# Patient Record
Sex: Female | Born: 1955 | Race: Black or African American | Hispanic: No | Marital: Married | Smoking: Never smoker
Health system: Southern US, Community
[De-identification: ages and names within clinical notes are randomized; demographics above are authoritative.]

## PROBLEM LIST (undated history)

## (undated) DIAGNOSIS — C50919 Malignant neoplasm of unspecified site of unspecified female breast: Secondary | ICD-10-CM

## (undated) DIAGNOSIS — J45909 Unspecified asthma, uncomplicated: Secondary | ICD-10-CM

## (undated) DIAGNOSIS — J189 Pneumonia, unspecified organism: Secondary | ICD-10-CM

## (undated) DIAGNOSIS — R0600 Dyspnea, unspecified: Secondary | ICD-10-CM

## (undated) DIAGNOSIS — I10 Essential (primary) hypertension: Secondary | ICD-10-CM

## (undated) DIAGNOSIS — F32A Depression, unspecified: Secondary | ICD-10-CM

## (undated) DIAGNOSIS — Z87442 Personal history of urinary calculi: Secondary | ICD-10-CM

## (undated) DIAGNOSIS — E119 Type 2 diabetes mellitus without complications: Secondary | ICD-10-CM

## (undated) DIAGNOSIS — K219 Gastro-esophageal reflux disease without esophagitis: Secondary | ICD-10-CM

## (undated) DIAGNOSIS — M1990 Unspecified osteoarthritis, unspecified site: Secondary | ICD-10-CM

## (undated) DIAGNOSIS — E785 Hyperlipidemia, unspecified: Secondary | ICD-10-CM

## (undated) HISTORY — DX: Type 2 diabetes mellitus without complications: E11.9

## (undated) HISTORY — PX: BREAST SURGERY: SHX581

## (undated) HISTORY — DX: Hyperlipidemia, unspecified: E78.5

## (undated) HISTORY — DX: Malignant neoplasm of unspecified site of unspecified female breast: C50.919

## (undated) HISTORY — PX: MASTECTOMY: SHX3

## (undated) HISTORY — PX: COLONOSCOPY: SHX174

## (undated) HISTORY — DX: Unspecified asthma, uncomplicated: J45.909

## (undated) HISTORY — PX: TUBAL LIGATION: SHX77

## (undated) HISTORY — DX: Essential (primary) hypertension: I10

---

## 2005-09-27 ENCOUNTER — Ambulatory Visit: Payer: Self-pay | Admitting: Cardiology

## 2014-02-05 ENCOUNTER — Encounter (INDEPENDENT_AMBULATORY_CARE_PROVIDER_SITE_OTHER): Payer: Self-pay | Admitting: Ophthalmology

## 2014-03-31 ENCOUNTER — Encounter (INDEPENDENT_AMBULATORY_CARE_PROVIDER_SITE_OTHER): Payer: Self-pay | Admitting: Ophthalmology

## 2014-04-07 ENCOUNTER — Encounter (INDEPENDENT_AMBULATORY_CARE_PROVIDER_SITE_OTHER): Payer: Self-pay | Admitting: Ophthalmology

## 2014-05-28 ENCOUNTER — Encounter (INDEPENDENT_AMBULATORY_CARE_PROVIDER_SITE_OTHER): Payer: Self-pay | Admitting: Ophthalmology

## 2014-06-06 ENCOUNTER — Encounter (INDEPENDENT_AMBULATORY_CARE_PROVIDER_SITE_OTHER): Payer: Medicare Other | Admitting: Ophthalmology

## 2014-06-06 DIAGNOSIS — H3531 Nonexudative age-related macular degeneration: Secondary | ICD-10-CM | POA: Diagnosis not present

## 2014-06-06 DIAGNOSIS — H43813 Vitreous degeneration, bilateral: Secondary | ICD-10-CM

## 2014-06-06 DIAGNOSIS — E11339 Type 2 diabetes mellitus with moderate nonproliferative diabetic retinopathy without macular edema: Secondary | ICD-10-CM

## 2014-06-06 DIAGNOSIS — E11319 Type 2 diabetes mellitus with unspecified diabetic retinopathy without macular edema: Secondary | ICD-10-CM

## 2014-06-06 DIAGNOSIS — I10 Essential (primary) hypertension: Secondary | ICD-10-CM | POA: Diagnosis not present

## 2014-06-06 DIAGNOSIS — H35033 Hypertensive retinopathy, bilateral: Secondary | ICD-10-CM

## 2015-02-11 ENCOUNTER — Emergency Department (HOSPITAL_COMMUNITY)
Admission: EM | Admit: 2015-02-11 | Discharge: 2015-02-11 | Discharge status: Absent without leave | Payer: Medicaid Other | Attending: Emergency Medicine | Admitting: Emergency Medicine

## 2015-02-11 NOTE — ED Notes (Signed)
Bed: MG50 Expected date: 02/11/15 Expected time: 8:18 PM Means of arrival: Ambulance Comments: 59 yr wheezing

## 2015-06-22 ENCOUNTER — Ambulatory Visit (INDEPENDENT_AMBULATORY_CARE_PROVIDER_SITE_OTHER): Payer: Medicare Other | Admitting: Ophthalmology

## 2015-06-29 DIAGNOSIS — Z1231 Encounter for screening mammogram for malignant neoplasm of breast: Secondary | ICD-10-CM | POA: Diagnosis not present

## 2015-06-29 DIAGNOSIS — Z9012 Acquired absence of left breast and nipple: Secondary | ICD-10-CM | POA: Diagnosis not present

## 2015-09-25 DIAGNOSIS — I10 Essential (primary) hypertension: Secondary | ICD-10-CM | POA: Diagnosis not present

## 2015-09-25 DIAGNOSIS — Z0000 Encounter for general adult medical examination without abnormal findings: Secondary | ICD-10-CM | POA: Diagnosis not present

## 2015-09-25 DIAGNOSIS — E119 Type 2 diabetes mellitus without complications: Secondary | ICD-10-CM | POA: Diagnosis not present

## 2015-12-25 DIAGNOSIS — E119 Type 2 diabetes mellitus without complications: Secondary | ICD-10-CM | POA: Diagnosis not present

## 2015-12-25 DIAGNOSIS — Z0000 Encounter for general adult medical examination without abnormal findings: Secondary | ICD-10-CM | POA: Diagnosis not present

## 2015-12-25 DIAGNOSIS — I10 Essential (primary) hypertension: Secondary | ICD-10-CM | POA: Diagnosis not present

## 2015-12-25 DIAGNOSIS — Z6829 Body mass index (BMI) 29.0-29.9, adult: Secondary | ICD-10-CM | POA: Diagnosis not present

## 2015-12-25 DIAGNOSIS — L658 Other specified nonscarring hair loss: Secondary | ICD-10-CM | POA: Diagnosis not present

## 2016-01-27 DIAGNOSIS — E119 Type 2 diabetes mellitus without complications: Secondary | ICD-10-CM | POA: Diagnosis not present

## 2016-01-27 DIAGNOSIS — I10 Essential (primary) hypertension: Secondary | ICD-10-CM | POA: Diagnosis not present

## 2016-03-08 DIAGNOSIS — I10 Essential (primary) hypertension: Secondary | ICD-10-CM | POA: Diagnosis not present

## 2016-03-08 DIAGNOSIS — E119 Type 2 diabetes mellitus without complications: Secondary | ICD-10-CM | POA: Diagnosis not present

## 2016-03-28 DIAGNOSIS — M1711 Unilateral primary osteoarthritis, right knee: Secondary | ICD-10-CM | POA: Diagnosis not present

## 2016-03-28 DIAGNOSIS — E119 Type 2 diabetes mellitus without complications: Secondary | ICD-10-CM | POA: Diagnosis not present

## 2016-03-28 DIAGNOSIS — I10 Essential (primary) hypertension: Secondary | ICD-10-CM | POA: Diagnosis not present

## 2016-04-12 DIAGNOSIS — I10 Essential (primary) hypertension: Secondary | ICD-10-CM | POA: Diagnosis not present

## 2016-04-12 DIAGNOSIS — E119 Type 2 diabetes mellitus without complications: Secondary | ICD-10-CM | POA: Diagnosis not present

## 2016-04-29 DIAGNOSIS — E119 Type 2 diabetes mellitus without complications: Secondary | ICD-10-CM | POA: Diagnosis not present

## 2016-04-29 DIAGNOSIS — I10 Essential (primary) hypertension: Secondary | ICD-10-CM | POA: Diagnosis not present

## 2016-04-29 DIAGNOSIS — M1711 Unilateral primary osteoarthritis, right knee: Secondary | ICD-10-CM | POA: Diagnosis not present

## 2016-05-10 DIAGNOSIS — E119 Type 2 diabetes mellitus without complications: Secondary | ICD-10-CM | POA: Diagnosis not present

## 2016-05-10 DIAGNOSIS — I10 Essential (primary) hypertension: Secondary | ICD-10-CM | POA: Diagnosis not present

## 2016-06-06 DIAGNOSIS — I10 Essential (primary) hypertension: Secondary | ICD-10-CM | POA: Diagnosis not present

## 2016-06-06 DIAGNOSIS — E119 Type 2 diabetes mellitus without complications: Secondary | ICD-10-CM | POA: Diagnosis not present

## 2016-06-13 DIAGNOSIS — E1140 Type 2 diabetes mellitus with diabetic neuropathy, unspecified: Secondary | ICD-10-CM | POA: Diagnosis not present

## 2016-06-13 DIAGNOSIS — L84 Corns and callosities: Secondary | ICD-10-CM | POA: Diagnosis not present

## 2016-07-01 DIAGNOSIS — E119 Type 2 diabetes mellitus without complications: Secondary | ICD-10-CM | POA: Diagnosis not present

## 2016-07-01 DIAGNOSIS — I10 Essential (primary) hypertension: Secondary | ICD-10-CM | POA: Diagnosis not present

## 2016-07-05 DIAGNOSIS — Z1231 Encounter for screening mammogram for malignant neoplasm of breast: Secondary | ICD-10-CM | POA: Diagnosis not present

## 2016-07-29 DIAGNOSIS — Z0000 Encounter for general adult medical examination without abnormal findings: Secondary | ICD-10-CM | POA: Diagnosis not present

## 2016-07-29 DIAGNOSIS — I10 Essential (primary) hypertension: Secondary | ICD-10-CM | POA: Diagnosis not present

## 2016-07-29 DIAGNOSIS — M1711 Unilateral primary osteoarthritis, right knee: Secondary | ICD-10-CM | POA: Diagnosis not present

## 2016-07-29 DIAGNOSIS — Z1389 Encounter for screening for other disorder: Secondary | ICD-10-CM | POA: Diagnosis not present

## 2016-07-29 DIAGNOSIS — E119 Type 2 diabetes mellitus without complications: Secondary | ICD-10-CM | POA: Diagnosis not present

## 2016-09-23 DIAGNOSIS — I10 Essential (primary) hypertension: Secondary | ICD-10-CM | POA: Diagnosis not present

## 2016-09-23 DIAGNOSIS — E119 Type 2 diabetes mellitus without complications: Secondary | ICD-10-CM | POA: Diagnosis not present

## 2016-10-26 DIAGNOSIS — E119 Type 2 diabetes mellitus without complications: Secondary | ICD-10-CM | POA: Diagnosis not present

## 2016-10-26 DIAGNOSIS — I10 Essential (primary) hypertension: Secondary | ICD-10-CM | POA: Diagnosis not present

## 2016-10-31 DIAGNOSIS — J40 Bronchitis, not specified as acute or chronic: Secondary | ICD-10-CM | POA: Diagnosis not present

## 2016-10-31 DIAGNOSIS — Z0000 Encounter for general adult medical examination without abnormal findings: Secondary | ICD-10-CM | POA: Diagnosis not present

## 2016-10-31 DIAGNOSIS — M1711 Unilateral primary osteoarthritis, right knee: Secondary | ICD-10-CM | POA: Diagnosis not present

## 2016-10-31 DIAGNOSIS — E119 Type 2 diabetes mellitus without complications: Secondary | ICD-10-CM | POA: Diagnosis not present

## 2016-10-31 DIAGNOSIS — I10 Essential (primary) hypertension: Secondary | ICD-10-CM | POA: Diagnosis not present

## 2016-12-15 DIAGNOSIS — M810 Age-related osteoporosis without current pathological fracture: Secondary | ICD-10-CM | POA: Diagnosis not present

## 2016-12-15 DIAGNOSIS — M85851 Other specified disorders of bone density and structure, right thigh: Secondary | ICD-10-CM | POA: Diagnosis not present

## 2017-01-30 DIAGNOSIS — I10 Essential (primary) hypertension: Secondary | ICD-10-CM | POA: Diagnosis not present

## 2017-01-30 DIAGNOSIS — E119 Type 2 diabetes mellitus without complications: Secondary | ICD-10-CM | POA: Diagnosis not present

## 2017-01-31 DIAGNOSIS — J3089 Other allergic rhinitis: Secondary | ICD-10-CM | POA: Diagnosis not present

## 2017-01-31 DIAGNOSIS — E119 Type 2 diabetes mellitus without complications: Secondary | ICD-10-CM | POA: Diagnosis not present

## 2017-01-31 DIAGNOSIS — I10 Essential (primary) hypertension: Secondary | ICD-10-CM | POA: Diagnosis not present

## 2017-02-12 DIAGNOSIS — W010XXA Fall on same level from slipping, tripping and stumbling without subsequent striking against object, initial encounter: Secondary | ICD-10-CM | POA: Diagnosis not present

## 2017-02-12 DIAGNOSIS — E119 Type 2 diabetes mellitus without complications: Secondary | ICD-10-CM | POA: Diagnosis not present

## 2017-02-12 DIAGNOSIS — I10 Essential (primary) hypertension: Secondary | ICD-10-CM | POA: Diagnosis not present

## 2017-02-12 DIAGNOSIS — S76912A Strain of unspecified muscles, fascia and tendons at thigh level, left thigh, initial encounter: Secondary | ICD-10-CM | POA: Diagnosis not present

## 2017-02-12 DIAGNOSIS — Z853 Personal history of malignant neoplasm of breast: Secondary | ICD-10-CM | POA: Diagnosis not present

## 2017-02-12 DIAGNOSIS — Z79899 Other long term (current) drug therapy: Secondary | ICD-10-CM | POA: Diagnosis not present

## 2017-02-12 DIAGNOSIS — M79652 Pain in left thigh: Secondary | ICD-10-CM | POA: Diagnosis not present

## 2017-02-12 DIAGNOSIS — Z7984 Long term (current) use of oral hypoglycemic drugs: Secondary | ICD-10-CM | POA: Diagnosis not present

## 2017-02-12 DIAGNOSIS — S8992XA Unspecified injury of left lower leg, initial encounter: Secondary | ICD-10-CM | POA: Diagnosis not present

## 2017-02-21 DIAGNOSIS — S7012XA Contusion of left thigh, initial encounter: Secondary | ICD-10-CM | POA: Diagnosis not present

## 2017-05-22 DIAGNOSIS — I10 Essential (primary) hypertension: Secondary | ICD-10-CM | POA: Diagnosis not present

## 2017-05-22 DIAGNOSIS — E119 Type 2 diabetes mellitus without complications: Secondary | ICD-10-CM | POA: Diagnosis not present

## 2017-06-06 DIAGNOSIS — J45998 Other asthma: Secondary | ICD-10-CM | POA: Diagnosis not present

## 2017-06-06 DIAGNOSIS — Z1389 Encounter for screening for other disorder: Secondary | ICD-10-CM | POA: Diagnosis not present

## 2017-06-06 DIAGNOSIS — M1711 Unilateral primary osteoarthritis, right knee: Secondary | ICD-10-CM | POA: Diagnosis not present

## 2017-06-06 DIAGNOSIS — E119 Type 2 diabetes mellitus without complications: Secondary | ICD-10-CM | POA: Diagnosis not present

## 2017-06-06 DIAGNOSIS — S7012XA Contusion of left thigh, initial encounter: Secondary | ICD-10-CM | POA: Diagnosis not present

## 2017-06-06 DIAGNOSIS — I10 Essential (primary) hypertension: Secondary | ICD-10-CM | POA: Diagnosis not present

## 2017-06-06 DIAGNOSIS — E1165 Type 2 diabetes mellitus with hyperglycemia: Secondary | ICD-10-CM | POA: Diagnosis not present

## 2017-06-06 DIAGNOSIS — Z0000 Encounter for general adult medical examination without abnormal findings: Secondary | ICD-10-CM | POA: Diagnosis not present

## 2017-06-07 DIAGNOSIS — I10 Essential (primary) hypertension: Secondary | ICD-10-CM | POA: Diagnosis not present

## 2017-06-07 DIAGNOSIS — E119 Type 2 diabetes mellitus without complications: Secondary | ICD-10-CM | POA: Diagnosis not present

## 2017-06-07 DIAGNOSIS — J45998 Other asthma: Secondary | ICD-10-CM | POA: Diagnosis not present

## 2017-07-04 DIAGNOSIS — J45998 Other asthma: Secondary | ICD-10-CM | POA: Diagnosis not present

## 2017-07-04 DIAGNOSIS — I10 Essential (primary) hypertension: Secondary | ICD-10-CM | POA: Diagnosis not present

## 2017-07-04 DIAGNOSIS — E119 Type 2 diabetes mellitus without complications: Secondary | ICD-10-CM | POA: Diagnosis not present

## 2017-07-06 DIAGNOSIS — Z1231 Encounter for screening mammogram for malignant neoplasm of breast: Secondary | ICD-10-CM | POA: Diagnosis not present

## 2017-08-23 DIAGNOSIS — J45998 Other asthma: Secondary | ICD-10-CM | POA: Diagnosis not present

## 2017-08-23 DIAGNOSIS — I10 Essential (primary) hypertension: Secondary | ICD-10-CM | POA: Diagnosis not present

## 2017-08-23 DIAGNOSIS — E119 Type 2 diabetes mellitus without complications: Secondary | ICD-10-CM | POA: Diagnosis not present

## 2017-09-04 DIAGNOSIS — Z0000 Encounter for general adult medical examination without abnormal findings: Secondary | ICD-10-CM | POA: Diagnosis not present

## 2017-09-04 DIAGNOSIS — M1711 Unilateral primary osteoarthritis, right knee: Secondary | ICD-10-CM | POA: Diagnosis not present

## 2017-09-04 DIAGNOSIS — J45998 Other asthma: Secondary | ICD-10-CM | POA: Diagnosis not present

## 2017-09-04 DIAGNOSIS — E119 Type 2 diabetes mellitus without complications: Secondary | ICD-10-CM | POA: Diagnosis not present

## 2017-09-04 DIAGNOSIS — I10 Essential (primary) hypertension: Secondary | ICD-10-CM | POA: Diagnosis not present

## 2017-12-05 DIAGNOSIS — Z0000 Encounter for general adult medical examination without abnormal findings: Secondary | ICD-10-CM | POA: Diagnosis not present

## 2017-12-05 DIAGNOSIS — I10 Essential (primary) hypertension: Secondary | ICD-10-CM | POA: Diagnosis not present

## 2017-12-05 DIAGNOSIS — E119 Type 2 diabetes mellitus without complications: Secondary | ICD-10-CM | POA: Diagnosis not present

## 2017-12-05 DIAGNOSIS — M1711 Unilateral primary osteoarthritis, right knee: Secondary | ICD-10-CM | POA: Diagnosis not present

## 2017-12-05 DIAGNOSIS — J45998 Other asthma: Secondary | ICD-10-CM | POA: Diagnosis not present

## 2018-01-19 DIAGNOSIS — E119 Type 2 diabetes mellitus without complications: Secondary | ICD-10-CM | POA: Diagnosis not present

## 2018-01-19 DIAGNOSIS — I10 Essential (primary) hypertension: Secondary | ICD-10-CM | POA: Diagnosis not present

## 2018-01-19 DIAGNOSIS — M1711 Unilateral primary osteoarthritis, right knee: Secondary | ICD-10-CM | POA: Diagnosis not present

## 2018-02-08 DIAGNOSIS — M1711 Unilateral primary osteoarthritis, right knee: Secondary | ICD-10-CM | POA: Diagnosis not present

## 2018-02-08 DIAGNOSIS — I10 Essential (primary) hypertension: Secondary | ICD-10-CM | POA: Diagnosis not present

## 2018-02-08 DIAGNOSIS — E119 Type 2 diabetes mellitus without complications: Secondary | ICD-10-CM | POA: Diagnosis not present

## 2018-03-06 DIAGNOSIS — E119 Type 2 diabetes mellitus without complications: Secondary | ICD-10-CM | POA: Diagnosis not present

## 2018-03-06 DIAGNOSIS — M1711 Unilateral primary osteoarthritis, right knee: Secondary | ICD-10-CM | POA: Diagnosis not present

## 2018-03-06 DIAGNOSIS — I10 Essential (primary) hypertension: Secondary | ICD-10-CM | POA: Diagnosis not present

## 2018-03-07 DIAGNOSIS — J45998 Other asthma: Secondary | ICD-10-CM | POA: Diagnosis not present

## 2018-03-07 DIAGNOSIS — E119 Type 2 diabetes mellitus without complications: Secondary | ICD-10-CM | POA: Diagnosis not present

## 2018-03-07 DIAGNOSIS — M1711 Unilateral primary osteoarthritis, right knee: Secondary | ICD-10-CM | POA: Diagnosis not present

## 2018-03-07 DIAGNOSIS — I10 Essential (primary) hypertension: Secondary | ICD-10-CM | POA: Diagnosis not present

## 2018-03-30 DIAGNOSIS — E119 Type 2 diabetes mellitus without complications: Secondary | ICD-10-CM | POA: Diagnosis not present

## 2018-03-30 DIAGNOSIS — I10 Essential (primary) hypertension: Secondary | ICD-10-CM | POA: Diagnosis not present

## 2018-03-30 DIAGNOSIS — M1711 Unilateral primary osteoarthritis, right knee: Secondary | ICD-10-CM | POA: Diagnosis not present

## 2018-06-06 DIAGNOSIS — I10 Essential (primary) hypertension: Secondary | ICD-10-CM | POA: Diagnosis not present

## 2018-06-06 DIAGNOSIS — J45998 Other asthma: Secondary | ICD-10-CM | POA: Diagnosis not present

## 2018-06-06 DIAGNOSIS — Z1389 Encounter for screening for other disorder: Secondary | ICD-10-CM | POA: Diagnosis not present

## 2018-06-06 DIAGNOSIS — M1711 Unilateral primary osteoarthritis, right knee: Secondary | ICD-10-CM | POA: Diagnosis not present

## 2018-06-06 DIAGNOSIS — E119 Type 2 diabetes mellitus without complications: Secondary | ICD-10-CM | POA: Diagnosis not present

## 2018-06-06 DIAGNOSIS — Z0000 Encounter for general adult medical examination without abnormal findings: Secondary | ICD-10-CM | POA: Diagnosis not present

## 2018-07-17 DIAGNOSIS — Z1231 Encounter for screening mammogram for malignant neoplasm of breast: Secondary | ICD-10-CM | POA: Diagnosis not present

## 2018-08-13 DIAGNOSIS — I10 Essential (primary) hypertension: Secondary | ICD-10-CM | POA: Diagnosis not present

## 2018-08-13 DIAGNOSIS — M1711 Unilateral primary osteoarthritis, right knee: Secondary | ICD-10-CM | POA: Diagnosis not present

## 2018-08-13 DIAGNOSIS — E119 Type 2 diabetes mellitus without complications: Secondary | ICD-10-CM | POA: Diagnosis not present

## 2018-09-05 DIAGNOSIS — J45998 Other asthma: Secondary | ICD-10-CM | POA: Diagnosis not present

## 2018-09-05 DIAGNOSIS — E119 Type 2 diabetes mellitus without complications: Secondary | ICD-10-CM | POA: Diagnosis not present

## 2018-09-05 DIAGNOSIS — Z0000 Encounter for general adult medical examination without abnormal findings: Secondary | ICD-10-CM | POA: Diagnosis not present

## 2018-09-05 DIAGNOSIS — M1711 Unilateral primary osteoarthritis, right knee: Secondary | ICD-10-CM | POA: Diagnosis not present

## 2018-09-05 DIAGNOSIS — I10 Essential (primary) hypertension: Secondary | ICD-10-CM | POA: Diagnosis not present

## 2018-09-10 DIAGNOSIS — E119 Type 2 diabetes mellitus without complications: Secondary | ICD-10-CM | POA: Diagnosis not present

## 2018-09-10 DIAGNOSIS — I10 Essential (primary) hypertension: Secondary | ICD-10-CM | POA: Diagnosis not present

## 2018-09-10 DIAGNOSIS — M1711 Unilateral primary osteoarthritis, right knee: Secondary | ICD-10-CM | POA: Diagnosis not present

## 2018-10-01 DIAGNOSIS — L600 Ingrowing nail: Secondary | ICD-10-CM | POA: Diagnosis not present

## 2018-12-05 DIAGNOSIS — E785 Hyperlipidemia, unspecified: Secondary | ICD-10-CM | POA: Diagnosis not present

## 2018-12-05 DIAGNOSIS — M1991 Primary osteoarthritis, unspecified site: Secondary | ICD-10-CM | POA: Diagnosis not present

## 2018-12-05 DIAGNOSIS — E119 Type 2 diabetes mellitus without complications: Secondary | ICD-10-CM | POA: Diagnosis not present

## 2018-12-05 DIAGNOSIS — I10 Essential (primary) hypertension: Secondary | ICD-10-CM | POA: Diagnosis not present

## 2019-03-07 DIAGNOSIS — I10 Essential (primary) hypertension: Secondary | ICD-10-CM | POA: Diagnosis not present

## 2019-03-07 DIAGNOSIS — E782 Mixed hyperlipidemia: Secondary | ICD-10-CM | POA: Diagnosis not present

## 2019-03-07 DIAGNOSIS — E119 Type 2 diabetes mellitus without complications: Secondary | ICD-10-CM | POA: Diagnosis not present

## 2019-03-07 DIAGNOSIS — M1991 Primary osteoarthritis, unspecified site: Secondary | ICD-10-CM | POA: Diagnosis not present

## 2019-03-17 DIAGNOSIS — H5213 Myopia, bilateral: Secondary | ICD-10-CM | POA: Diagnosis not present

## 2019-03-25 DIAGNOSIS — H40013 Open angle with borderline findings, low risk, bilateral: Secondary | ICD-10-CM | POA: Diagnosis not present

## 2019-04-25 DIAGNOSIS — E119 Type 2 diabetes mellitus without complications: Secondary | ICD-10-CM | POA: Diagnosis not present

## 2019-04-25 DIAGNOSIS — E782 Mixed hyperlipidemia: Secondary | ICD-10-CM | POA: Diagnosis not present

## 2019-04-25 DIAGNOSIS — I10 Essential (primary) hypertension: Secondary | ICD-10-CM | POA: Diagnosis not present

## 2019-06-04 DIAGNOSIS — E782 Mixed hyperlipidemia: Secondary | ICD-10-CM | POA: Diagnosis not present

## 2019-06-04 DIAGNOSIS — M1991 Primary osteoarthritis, unspecified site: Secondary | ICD-10-CM | POA: Diagnosis not present

## 2019-06-04 DIAGNOSIS — I10 Essential (primary) hypertension: Secondary | ICD-10-CM | POA: Diagnosis not present

## 2019-06-04 DIAGNOSIS — E119 Type 2 diabetes mellitus without complications: Secondary | ICD-10-CM | POA: Diagnosis not present

## 2019-08-26 DIAGNOSIS — I10 Essential (primary) hypertension: Secondary | ICD-10-CM | POA: Diagnosis not present

## 2019-08-26 DIAGNOSIS — E119 Type 2 diabetes mellitus without complications: Secondary | ICD-10-CM | POA: Diagnosis not present

## 2019-08-26 DIAGNOSIS — E782 Mixed hyperlipidemia: Secondary | ICD-10-CM | POA: Diagnosis not present

## 2019-09-09 DIAGNOSIS — E782 Mixed hyperlipidemia: Secondary | ICD-10-CM | POA: Diagnosis not present

## 2019-09-09 DIAGNOSIS — J4520 Mild intermittent asthma, uncomplicated: Secondary | ICD-10-CM | POA: Diagnosis not present

## 2019-09-09 DIAGNOSIS — Z0000 Encounter for general adult medical examination without abnormal findings: Secondary | ICD-10-CM | POA: Diagnosis not present

## 2019-09-09 DIAGNOSIS — M1991 Primary osteoarthritis, unspecified site: Secondary | ICD-10-CM | POA: Diagnosis not present

## 2019-09-09 DIAGNOSIS — I10 Essential (primary) hypertension: Secondary | ICD-10-CM | POA: Diagnosis not present

## 2019-09-09 DIAGNOSIS — E119 Type 2 diabetes mellitus without complications: Secondary | ICD-10-CM | POA: Diagnosis not present

## 2019-09-24 DIAGNOSIS — Z20822 Contact with and (suspected) exposure to covid-19: Secondary | ICD-10-CM | POA: Diagnosis not present

## 2019-09-24 DIAGNOSIS — R062 Wheezing: Secondary | ICD-10-CM | POA: Diagnosis not present

## 2019-09-24 DIAGNOSIS — E119 Type 2 diabetes mellitus without complications: Secondary | ICD-10-CM | POA: Diagnosis not present

## 2019-09-24 DIAGNOSIS — Z7984 Long term (current) use of oral hypoglycemic drugs: Secondary | ICD-10-CM | POA: Diagnosis not present

## 2019-09-24 DIAGNOSIS — J4541 Moderate persistent asthma with (acute) exacerbation: Secondary | ICD-10-CM | POA: Diagnosis not present

## 2019-09-24 DIAGNOSIS — E785 Hyperlipidemia, unspecified: Secondary | ICD-10-CM | POA: Diagnosis not present

## 2019-09-24 DIAGNOSIS — R061 Stridor: Secondary | ICD-10-CM | POA: Diagnosis not present

## 2019-09-24 DIAGNOSIS — G8929 Other chronic pain: Secondary | ICD-10-CM | POA: Diagnosis not present

## 2019-09-24 DIAGNOSIS — I10 Essential (primary) hypertension: Secondary | ICD-10-CM | POA: Diagnosis not present

## 2019-09-24 DIAGNOSIS — M549 Dorsalgia, unspecified: Secondary | ICD-10-CM | POA: Diagnosis not present

## 2019-10-07 DIAGNOSIS — J4541 Moderate persistent asthma with (acute) exacerbation: Secondary | ICD-10-CM | POA: Diagnosis not present

## 2019-11-18 DIAGNOSIS — M5432 Sciatica, left side: Secondary | ICD-10-CM | POA: Diagnosis not present

## 2019-11-18 DIAGNOSIS — Z0000 Encounter for general adult medical examination without abnormal findings: Secondary | ICD-10-CM | POA: Diagnosis not present

## 2019-12-09 DIAGNOSIS — M546 Pain in thoracic spine: Secondary | ICD-10-CM | POA: Diagnosis not present

## 2019-12-09 DIAGNOSIS — M25562 Pain in left knee: Secondary | ICD-10-CM | POA: Diagnosis not present

## 2019-12-09 DIAGNOSIS — M545 Low back pain: Secondary | ICD-10-CM | POA: Diagnosis not present

## 2019-12-09 DIAGNOSIS — Z9889 Other specified postprocedural states: Secondary | ICD-10-CM | POA: Diagnosis not present

## 2019-12-09 DIAGNOSIS — M1712 Unilateral primary osteoarthritis, left knee: Secondary | ICD-10-CM | POA: Diagnosis not present

## 2019-12-09 DIAGNOSIS — M4814 Ankylosing hyperostosis [Forestier], thoracic region: Secondary | ICD-10-CM | POA: Diagnosis not present

## 2019-12-09 DIAGNOSIS — M5432 Sciatica, left side: Secondary | ICD-10-CM | POA: Diagnosis not present

## 2019-12-11 DIAGNOSIS — M4814 Ankylosing hyperostosis [Forestier], thoracic region: Secondary | ICD-10-CM | POA: Diagnosis not present

## 2019-12-17 DIAGNOSIS — M545 Low back pain: Secondary | ICD-10-CM | POA: Diagnosis not present

## 2019-12-19 DIAGNOSIS — M545 Low back pain: Secondary | ICD-10-CM | POA: Diagnosis not present

## 2019-12-24 DIAGNOSIS — M545 Low back pain: Secondary | ICD-10-CM | POA: Diagnosis not present

## 2019-12-26 DIAGNOSIS — M545 Low back pain: Secondary | ICD-10-CM | POA: Diagnosis not present

## 2019-12-31 DIAGNOSIS — M545 Low back pain: Secondary | ICD-10-CM | POA: Diagnosis not present

## 2020-01-02 DIAGNOSIS — M545 Low back pain: Secondary | ICD-10-CM | POA: Diagnosis not present

## 2020-01-04 DIAGNOSIS — I10 Essential (primary) hypertension: Secondary | ICD-10-CM | POA: Diagnosis not present

## 2020-01-04 DIAGNOSIS — E7849 Other hyperlipidemia: Secondary | ICD-10-CM | POA: Diagnosis not present

## 2020-01-04 DIAGNOSIS — E119 Type 2 diabetes mellitus without complications: Secondary | ICD-10-CM | POA: Diagnosis not present

## 2020-01-07 DIAGNOSIS — M545 Low back pain: Secondary | ICD-10-CM | POA: Diagnosis not present

## 2020-01-13 DIAGNOSIS — Z0389 Encounter for observation for other suspected diseases and conditions ruled out: Secondary | ICD-10-CM | POA: Diagnosis not present

## 2020-01-13 DIAGNOSIS — M810 Age-related osteoporosis without current pathological fracture: Secondary | ICD-10-CM | POA: Diagnosis not present

## 2020-01-31 DIAGNOSIS — E7849 Other hyperlipidemia: Secondary | ICD-10-CM | POA: Diagnosis not present

## 2020-01-31 DIAGNOSIS — E119 Type 2 diabetes mellitus without complications: Secondary | ICD-10-CM | POA: Diagnosis not present

## 2020-01-31 DIAGNOSIS — I10 Essential (primary) hypertension: Secondary | ICD-10-CM | POA: Diagnosis not present

## 2020-08-05 ENCOUNTER — Encounter: Payer: Self-pay | Admitting: Internal Medicine

## 2020-09-12 ENCOUNTER — Encounter: Payer: Self-pay | Admitting: Gastroenterology

## 2020-09-12 NOTE — Progress Notes (Deleted)
   Referring Provider: Neale Burly, MD Primary Care Physician:  Neale Burly, MD Primary Gastroenterologist:  Dr. Abbey Chatters  No chief complaint on file.   HPI:   Daylee Delahoz is a 65 y.o. female presenting today at the request of Hasanaj, Samul Dada, MD for dysphagia.   Abdominal ultrasound complete 07/08/2020: Unremarkable gallbladder, CBD within normal limits, increased liver echogenicity, spleen normal, right kidney cyst.      Past Medical History:  Diagnosis Date  . Diabetes ([place])   . HLD (hyperlipidemia)   . HTN (hypertension)     *** The histories are not reviewed yet. Please review them in the "History" navigator section and refresh this Hernandez.  No current outpatient medications on file.   No current facility-administered medications for this visit.    Allergies as of 09/14/2020  . (Not on File)    No family history on file.  Social History   Socioeconomic History  . Marital status: Married    Spouse name: Not on file  . Number of children: Not on file  . Years of education: Not on file  . Highest education level: Not on file  Occupational History  . Not on file  Tobacco Use  . Smoking status: Not on file  . Smokeless tobacco: Not on file  Substance and Sexual Activity  . Alcohol use: Not on file  . Drug use: Not on file  . Sexual activity: Not on file  Other Topics Concern  . Not on file  Social History Narrative  . Not on file   Social Determinants of Health   Financial Resource Strain: Not on file  Food Insecurity: Not on file  Transportation Needs: Not on file  Physical Activity: Not on file  Stress: Not on file  Social Connections: Not on file  Intimate Partner Violence: Not on file    Review of Systems: Gen: Denies any fever, chills, fatigue, weight loss, lack of appetite.  CV: Denies chest pain, heart palpitations, peripheral edema, syncope.  Resp: Denies shortness of breath at rest or with exertion. Denies wheezing or  cough.  GI: Denies dysphagia or odynophagia. Denies jaundice, hematemesis, fecal incontinence. GU : Denies urinary burning, urinary frequency, urinary hesitancy MS: Denies joint pain, muscle weakness, cramps, or limitation of movement.  Derm: Denies rash, itching, dry skin Psych: Denies depression, anxiety, memory loss, and confusion Heme: Denies bruising, bleeding, and enlarged lymph nodes.  Physical Exam: There were no vitals taken for this visit. General:   Alert and oriented. Pleasant and cooperative. Well-nourished and well-developed.  Head:  Normocephalic and atraumatic. Eyes:  Without icterus, sclera clear and conjunctiva pink.  Ears:  Normal auditory acuity. Nose:  No deformity, discharge,  or lesions. Mouth:  No deformity or lesions, oral mucosa pink.  Neck:  Supple, without mass or thyromegaly. Lungs:  Clear to auscultation bilaterally. No wheezes, rales, or rhonchi. No distress.  Heart:  S1, S2 present without murmurs appreciated.  Abdomen:  +BS, soft, non-tender and non-distended. No HSM noted. No guarding or rebound. No masses appreciated.  Rectal:  Deferred  Msk:  Symmetrical without gross deformities. Normal posture. Pulses:  Normal pulses noted. Extremities:  Without clubbing or edema. Neurologic:  Alert and  oriented x4;  grossly normal neurologically. Skin:  Intact without significant lesions or rashes. Cervical Nodes:  No significant cervical adenopathy. Psych:  Alert and cooperative. Normal mood and affect.

## 2020-09-14 ENCOUNTER — Encounter: Payer: Self-pay | Admitting: Internal Medicine

## 2020-09-14 ENCOUNTER — Ambulatory Visit: Payer: 59 | Admitting: Gastroenterology

## 2021-02-13 NOTE — H&P (View-Only) (Signed)
Referring Provider:Hasanaj, Samul Dada, MD Primary Care Physician:  Neale Burly, MD Primary Gastroenterologist:  Dr. Abbey Chatters  Chief Complaint  Patient presents with   Dysphagia    Going on x 1 month. Mostly solid foods    HPI:   Taylor Cohen is a 65 y.o. female presenting today at the request of Hasanaj, Samul Dada, MD for dysphagia.  Dysphagia x 1 month, primarily to solid foods and pills.  Occurs 1-2 times a week with sensation of food getting hung in her mid chest.  When this occurs, she also develops pain in this area.  Items passed with water.  Admits to occasional reflux occurring once every couple of weeks or less. Doesn't take anything for this. No nausea, vomiting, or abdominal pain.   Denies brbpr or melena. Rare diarrhea, triggered by greasy foods.  Colonoscopy at [facility] without polyps.  No prior EGD.   Diabetes has been well controlled.  Last A1c around 5.7.  Has chronic back and hip pain.  Uses Tylenol as needed.   Past Medical History:  Diagnosis Date   Asthma    Breast cancer ([place])    Around 2004-2005. Left; s/p mastectomy and chemo   Diabetes ([place])    HLD (hyperlipidemia)    HTN (hypertension)     Past Surgical History:  Procedure Laterality Date   COLONOSCOPY     [facility]   MASTECTOMY Left    2004 or 2005   TUBAL LIGATION      Current Outpatient Medications  Medication Sig Dispense Refill   ALBUTEROL IN Inhale into the lungs daily as needed.     amLODipine (NORVASC) 10 MG tablet Take 10 mg by mouth daily.     CALCIUM PO Take by mouth daily.     citalopram (CELEXA) 20 MG tablet Take 20 mg by mouth daily.     glipiZIDE (GLUCOTROL XL) 5 MG 24 hr tablet Take 5 mg by mouth daily.     hydrochlorothiazide (HYDRODIURIL) 25 MG tablet Take 25 mg by mouth daily.     lisinopril (ZESTRIL) 20 MG tablet Take by mouth daily.     metFORMIN (GLUCOPHAGE) 1000 MG tablet Take 1,000 mg by mouth every morning.     Multiple Vitamin (MULTIVITAMIN) tablet  Take 1 tablet by mouth daily.     omega-3 acid ethyl esters (LOVAZA) 1 g capsule Take 1 capsule by mouth 2 (two) times daily.     pravastatin (PRAVACHOL) 40 MG tablet Take 40 mg by mouth daily.     No current facility-administered medications for this visit.    Allergies as of 02/15/2021   (No Known Allergies)    Family History  Problem Relation Age of Onset   Colon cancer Neg Hx    Stomach cancer Neg Hx    Esophageal cancer Neg Hx     Social History   Socioeconomic History   Marital status: Married    Spouse name: Not on file   Number of children: Not on file   Years of education: Not on file   Highest education level: Not on file  Occupational History   Not on file  Tobacco Use   Smoking status: Never   Smokeless tobacco: Never  Substance and Sexual Activity   Alcohol use: Never   Drug use: Never   Sexual activity: Not on file  Other Topics Concern   Not on file  Social History Narrative   Not on file   Social Determinants of Health  Financial Resource Strain: Not on file  Food Insecurity: Not on file  Transportation Needs: Not on file  Physical Activity: Not on file  Stress: Not on file  Social Connections: Not on file  Intimate Partner Violence: Not on file    Review of Systems: Gen: Denies any fever, chills, cold or flulike symptoms, presyncope, syncope.  Admits to occasional postnasal drainage secondary to allergies. CV: Denies chest pain, heart palpitations. Resp: Denies shortness of breath.  Admits to occasional cough related to pollen with history of asthma. GI: See HPI GU : Denies urinary burning, urinary frequency, urinary hesitancy MS: Chronic intermittent back and left hip pain. Derm: Denies rash Psych: Denies depression, anxiety Heme: See HPI  Physical Exam: BP 130/72   Pulse 91   Temp (!) 97.5 F (36.4 C)   Ht 5\' 4"  (1.626 m)   Wt 174 lb 6.4 oz (79.1 kg)   BMI 29.94 kg/m  General:   Alert and oriented. Pleasant and cooperative.  Well-nourished and well-developed.  Head:  Normocephalic and atraumatic. Eyes:  Without icterus, sclera clear and conjunctiva pink.  Ears:  Normal auditory acuity. Lungs:  Clear to auscultation bilaterally. No wheezes, rales, or rhonchi. No distress.  Heart:  S1, S2 present without murmurs appreciated.  Abdomen:  +BS, soft, non-tender and non-distended. No HSM noted. No guarding or rebound. No masses appreciated.  Rectal:  Deferred  Msk:  Symmetrical without gross deformities. Normal posture. Extremities:  Without edema. Neurologic:  Alert and  oriented x4;  grossly normal neurologically. Skin:  Intact without significant lesions or rashes. Cervical Nodes:  No significant cervical adenopathy. Psych:  Normal mood and affect.    Assessment: 65 year old female with history of breast cancer s/p mastectomy in early 2000's, asthma, type 2 diabetes, HTN, HLD presenting today at the request of primary care for further evaluation of dysphagia.  Patient reports solid food and pill dysphagia x1 month with sensation of items getting stuck in her mid chest with associated chest discomfort at that time.  Symptoms resolved with water, no regurgitation.  Fairly rare GERD symptoms, not requiring medications.  No other significant GI symptoms.  No prior EGD.  Differentials include esophageal web, ring, stricture, EOE, and less likely malignancy.   Plan: 1.  Proceed with EGD +/- dilation with propofol with Dr. Abbey Chatters in the near future. The risks, benefits, and alternatives have been discussed with the patient in detail. The patient states understanding and desires to proceed.  ASA II No morning diabetes medications day of procedure. 2.  Meats should be chopped finely. 3.  Eat slowly, take small bites, chew thoroughly, and drink plenty of liquids throughout meals. 4.  Proceed to the emergency room if something gets stuck in her esophagus and will not come up or go down. 5.  Follow-up after  procedure.    [name], PA-C [facility] 02/15/2021

## 2021-02-13 NOTE — Progress Notes (Signed)
Referring Provider:Hasanaj, Samul Dada, MD Primary Care Physician:  Neale Burly, MD Primary Gastroenterologist:  Dr. Abbey Chatters  Chief Complaint  Patient presents with   Dysphagia    Going on x 1 month. Mostly solid foods    HPI:   Taylor Cohen is a 65 y.o. female presenting today at the request of Hasanaj, Samul Dada, MD for dysphagia.  Dysphagia x 1 month, primarily to solid foods and pills.  Occurs 1-2 times a week with sensation of food getting hung in her mid chest.  When this occurs, she also develops pain in this area.  Items passed with water.  Admits to occasional reflux occurring once every couple of weeks or less. Doesn't take anything for this. No nausea, vomiting, or abdominal pain.   Denies brbpr or melena. Rare diarrhea, triggered by greasy foods.  Colonoscopy at [facility] without polyps.  No prior EGD.   Diabetes has been well controlled.  Last A1c around 5.7.  Has chronic back and hip pain.  Uses Tylenol as needed.   Past Medical History:  Diagnosis Date   Asthma    Breast cancer ([place])    Around 2004-2005. Left; s/p mastectomy and chemo   Diabetes ([place])    HLD (hyperlipidemia)    HTN (hypertension)     Past Surgical History:  Procedure Laterality Date   COLONOSCOPY     [facility]   MASTECTOMY Left    2004 or 2005   TUBAL LIGATION      Current Outpatient Medications  Medication Sig Dispense Refill   ALBUTEROL IN Inhale into the lungs daily as needed.     amLODipine (NORVASC) 10 MG tablet Take 10 mg by mouth daily.     CALCIUM PO Take by mouth daily.     citalopram (CELEXA) 20 MG tablet Take 20 mg by mouth daily.     glipiZIDE (GLUCOTROL XL) 5 MG 24 hr tablet Take 5 mg by mouth daily.     hydrochlorothiazide (HYDRODIURIL) 25 MG tablet Take 25 mg by mouth daily.     lisinopril (ZESTRIL) 20 MG tablet Take by mouth daily.     metFORMIN (GLUCOPHAGE) 1000 MG tablet Take 1,000 mg by mouth every morning.     Multiple Vitamin (MULTIVITAMIN) tablet  Take 1 tablet by mouth daily.     omega-3 acid ethyl esters (LOVAZA) 1 g capsule Take 1 capsule by mouth 2 (two) times daily.     pravastatin (PRAVACHOL) 40 MG tablet Take 40 mg by mouth daily.     No current facility-administered medications for this visit.    Allergies as of 02/15/2021   (No Known Allergies)    Family History  Problem Relation Age of Onset   Colon cancer Neg Hx    Stomach cancer Neg Hx    Esophageal cancer Neg Hx     Social History   Socioeconomic History   Marital status: Married    Spouse name: Not on file   Number of children: Not on file   Years of education: Not on file   Highest education level: Not on file  Occupational History   Not on file  Tobacco Use   Smoking status: Never   Smokeless tobacco: Never  Substance and Sexual Activity   Alcohol use: Never   Drug use: Never   Sexual activity: Not on file  Other Topics Concern   Not on file  Social History Narrative   Not on file   Social Determinants of Health  Financial Resource Strain: Not on file  Food Insecurity: Not on file  Transportation Needs: Not on file  Physical Activity: Not on file  Stress: Not on file  Social Connections: Not on file  Intimate Partner Violence: Not on file    Review of Systems: Gen: Denies any fever, chills, cold or flulike symptoms, presyncope, syncope.  Admits to occasional postnasal drainage secondary to allergies. CV: Denies chest pain, heart palpitations. Resp: Denies shortness of breath.  Admits to occasional cough related to pollen with history of asthma. GI: See HPI GU : Denies urinary burning, urinary frequency, urinary hesitancy MS: Chronic intermittent back and left hip pain. Derm: Denies rash Psych: Denies depression, anxiety Heme: See HPI  Physical Exam: BP 130/72   Pulse 91   Temp (!) 97.5 F (36.4 C)   Ht 5\' 4"  (1.626 m)   Wt 174 lb 6.4 oz (79.1 kg)   BMI 29.94 kg/m  General:   Alert and oriented. Pleasant and cooperative.  Well-nourished and well-developed.  Head:  Normocephalic and atraumatic. Eyes:  Without icterus, sclera clear and conjunctiva pink.  Ears:  Normal auditory acuity. Lungs:  Clear to auscultation bilaterally. No wheezes, rales, or rhonchi. No distress.  Heart:  S1, S2 present without murmurs appreciated.  Abdomen:  +BS, soft, non-tender and non-distended. No HSM noted. No guarding or rebound. No masses appreciated.  Rectal:  Deferred  Msk:  Symmetrical without gross deformities. Normal posture. Extremities:  Without edema. Neurologic:  Alert and  oriented x4;  grossly normal neurologically. Skin:  Intact without significant lesions or rashes. Cervical Nodes:  No significant cervical adenopathy. Psych:  Normal mood and affect.    Assessment: 65 year old female with history of breast cancer s/p mastectomy in early 2000's, asthma, type 2 diabetes, HTN, HLD presenting today at the request of primary care for further evaluation of dysphagia.  Patient reports solid food and pill dysphagia x1 month with sensation of items getting stuck in her mid chest with associated chest discomfort at that time.  Symptoms resolved with water, no regurgitation.  Fairly rare GERD symptoms, not requiring medications.  No other significant GI symptoms.  No prior EGD.  Differentials include esophageal web, ring, stricture, EOE, and less likely malignancy.   Plan: 1.  Proceed with EGD +/- dilation with propofol with Dr. Abbey Chatters in the near future. The risks, benefits, and alternatives have been discussed with the patient in detail. The patient states understanding and desires to proceed.  ASA II No morning diabetes medications day of procedure. 2.  Meats should be chopped finely. 3.  Eat slowly, take small bites, chew thoroughly, and drink plenty of liquids throughout meals. 4.  Proceed to the emergency room if something gets stuck in her esophagus and will not come up or go down. 5.  Follow-up after  procedure.    Taylor Altes, PA-C [facility] 02/15/2021

## 2021-02-15 ENCOUNTER — Telehealth: Payer: Self-pay

## 2021-02-15 ENCOUNTER — Other Ambulatory Visit: Payer: Self-pay

## 2021-02-15 ENCOUNTER — Ambulatory Visit (INDEPENDENT_AMBULATORY_CARE_PROVIDER_SITE_OTHER): Payer: 59 | Admitting: Gastroenterology

## 2021-02-15 ENCOUNTER — Encounter: Payer: Self-pay | Admitting: Gastroenterology

## 2021-02-15 VITALS — BP 130/72 | HR 91 | Temp 97.5°F | Ht 64.0 in | Wt 174.4 lb

## 2021-02-15 DIAGNOSIS — R1310 Dysphagia, unspecified: Secondary | ICD-10-CM

## 2021-02-15 NOTE — Patient Instructions (Signed)
We will arrange for her to have an upper endoscopy with possible stretching of your esophagus in the near future with Dr. Abbey Chatters. Do not take any diabetes medications the morning of your procedure.  For now, you should eat slowly, take small bites, chew thoroughly, and drink plenty of liquids throughout your meals.  Also recommend that all meats are chopped finely.  If something gets stuck in your esophagus and will not come up or go down, you should proceed to the emergency room.  We will plan to see you back after your procedure.  Do not hesitate to call with questions or concerns prior to her next visit.   It was a pleasure meeting you today!  [name], PA-C [facility]

## 2021-02-15 NOTE — Telephone Encounter (Signed)
PA for EGD/DIL submitted via [facility] website. PA# [id number], valid 03/02/21-05/22/21.

## 2021-02-26 ENCOUNTER — Other Ambulatory Visit (HOSPITAL_COMMUNITY)
Admission: RE | Admit: 2021-02-26 | Discharge: 2021-02-26 | Disposition: A | Discharge status: Home / Self Care | Payer: 59 | Source: Ambulatory Visit | Attending: Internal Medicine | Admitting: Internal Medicine

## 2021-02-26 DIAGNOSIS — R1310 Dysphagia, unspecified: Secondary | ICD-10-CM | POA: Diagnosis present

## 2021-02-26 LAB — BASIC METABOLIC PANEL
Anion gap: 8 (ref 5–15)
BUN: 21 mg/dL (ref 8–23)
CO2: 27 mmol/L (ref 22–32)
Calcium: 9.1 mg/dL (ref 8.9–10.3)
Chloride: 101 mmol/L (ref 98–111)
Creatinine, Ser: 0.84 mg/dL (ref 0.44–1.00)
GFR, Estimated: >60 mL/min (ref >60)
Glucose, Bld: 134 mg/dL — ABNORMAL HIGH (ref 70–99)
Potassium: 3.8 mmol/L (ref 3.5–5.1)
Sodium: 136 mmol/L (ref 135–145)

## 2021-03-02 ENCOUNTER — Other Ambulatory Visit: Payer: Self-pay

## 2021-03-02 ENCOUNTER — Ambulatory Visit (HOSPITAL_COMMUNITY): Payer: 59 | Admitting: Anesthesiology

## 2021-03-02 ENCOUNTER — Encounter (HOSPITAL_COMMUNITY)
Admission: RE | Disposition: A | Discharge status: Home / Self Care | Payer: Self-pay | Source: Home / Self Care | Attending: Internal Medicine

## 2021-03-02 ENCOUNTER — Encounter (HOSPITAL_COMMUNITY): Payer: Self-pay

## 2021-03-02 ENCOUNTER — Ambulatory Visit (HOSPITAL_COMMUNITY)
Admission: RE | Admit: 2021-03-02 | Discharge: 2021-03-02 | Disposition: A | Discharge status: Home / Self Care | Payer: 59 | Attending: Internal Medicine | Admitting: Internal Medicine

## 2021-03-02 DIAGNOSIS — Z853 Personal history of malignant neoplasm of breast: Secondary | ICD-10-CM | POA: Insufficient documentation

## 2021-03-02 DIAGNOSIS — I10 Essential (primary) hypertension: Secondary | ICD-10-CM | POA: Insufficient documentation

## 2021-03-02 DIAGNOSIS — Z7984 Long term (current) use of oral hypoglycemic drugs: Secondary | ICD-10-CM | POA: Diagnosis not present

## 2021-03-02 DIAGNOSIS — K2100 Gastro-esophageal reflux disease with esophagitis, without bleeding: Secondary | ICD-10-CM | POA: Diagnosis not present

## 2021-03-02 DIAGNOSIS — K2970 Gastritis, unspecified, without bleeding: Secondary | ICD-10-CM | POA: Diagnosis not present

## 2021-03-02 DIAGNOSIS — Z9012 Acquired absence of left breast and nipple: Secondary | ICD-10-CM | POA: Diagnosis not present

## 2021-03-02 DIAGNOSIS — B9681 Helicobacter pylori [H. pylori] as the cause of diseases classified elsewhere: Secondary | ICD-10-CM | POA: Diagnosis not present

## 2021-03-02 DIAGNOSIS — Z79899 Other long term (current) drug therapy: Secondary | ICD-10-CM | POA: Diagnosis not present

## 2021-03-02 DIAGNOSIS — K222 Esophageal obstruction: Secondary | ICD-10-CM | POA: Diagnosis not present

## 2021-03-02 DIAGNOSIS — E119 Type 2 diabetes mellitus without complications: Secondary | ICD-10-CM | POA: Diagnosis not present

## 2021-03-02 DIAGNOSIS — R1310 Dysphagia, unspecified: Secondary | ICD-10-CM | POA: Diagnosis not present

## 2021-03-02 DIAGNOSIS — E785 Hyperlipidemia, unspecified: Secondary | ICD-10-CM | POA: Diagnosis not present

## 2021-03-02 DIAGNOSIS — K449 Diaphragmatic hernia without obstruction or gangrene: Secondary | ICD-10-CM | POA: Insufficient documentation

## 2021-03-02 DIAGNOSIS — R1013 Epigastric pain: Secondary | ICD-10-CM | POA: Diagnosis present

## 2021-03-02 HISTORY — PX: BALLOON DILATION: SHX5330

## 2021-03-02 HISTORY — PX: ESOPHAGOGASTRODUODENOSCOPY (EGD) WITH PROPOFOL: SHX5813

## 2021-03-02 HISTORY — PX: BIOPSY: SHX5522

## 2021-03-02 LAB — GLUCOSE, CAPILLARY: Glucose-Capillary: 138 mg/dL — ABNORMAL HIGH (ref 70–99)

## 2021-03-02 SURGERY — ESOPHAGOGASTRODUODENOSCOPY (EGD) WITH PROPOFOL
Anesthesia: General

## 2021-03-02 MED ORDER — LACTATED RINGERS IV SOLN: INTRAVENOUS | Status: DC

## 2021-03-02 MED ORDER — LIDOCAINE HCL (CARDIAC) PF 100 MG/5ML IV SOSY
PREFILLED_SYRINGE | INTRAVENOUS | Status: DC | PRN
  Administered 2021-03-02: 50 mg via INTRAVENOUS

## 2021-03-02 MED ORDER — PROPOFOL 10 MG/ML IV BOLUS
INTRAVENOUS | Status: DC | PRN
  Administered 2021-03-02: 100 mg via INTRAVENOUS
  Administered 2021-03-02: 50 mg via INTRAVENOUS
  Administered 2021-03-02: 30 mg via INTRAVENOUS

## 2021-03-02 MED ORDER — OMEPRAZOLE 20 MG PO CPDR: 20.0000 mg | DELAYED_RELEASE_CAPSULE | Freq: Two times a day (BID) | ORAL | 5 refills | Status: DC

## 2021-03-02 MED ORDER — STERILE WATER FOR IRRIGATION IR SOLN
Status: DC | PRN
  Administered 2021-03-02: 100 mL

## 2021-03-02 NOTE — Transfer of Care (Signed)
Immediate Anesthesia Transfer of Care Note  Patient: Taylor Cohen  Procedure(s) Performed: ESOPHAGOGASTRODUODENOSCOPY (EGD) WITH PROPOFOL BALLOON DILATION BIOPSY  Patient Location: Endoscopy Unit  Anesthesia Type:General  Level of Consciousness: drowsy  Airway & Oxygen Therapy: Patient Spontanous Breathing  Post-op Assessment: Report given to RN and Post -op Vital signs reviewed and stable  Post vital signs: Reviewed and stable  Last Vitals:  Vitals Value Taken Time  BP    Temp    Pulse    Resp    SpO2      Last Pain:  Vitals:   03/02/21 0952  TempSrc:   PainSc: 0-No pain      Patients Stated Pain Goal: 6 (04/07[birth year] 0802)  Complications: No notable events documented.

## 2021-03-02 NOTE — Op Note (Signed)
[facility] Patient Name: Taylor Cohen Procedure Date: 03/02/2021 9:45 AM MRN: [id number] Date of Birth: 31-Aug-1955 Attending MD: Elon Alas. Edgar Frisk CSN: [id number] Age: 65 Admit Type: Outpatient Procedure:                Upper GI endoscopy Indications:              Epigastric abdominal pain, Dysphagia Providers:                Elon Alas. Abbey Chatters, DO, Caprice Kluver, [place]                            Risa Grill, Technician Referring MD:              Medicines:                See the Anesthesia note for documentation of the                            administered medications Complications:            No immediate complications. Estimated Blood Loss:     Estimated blood loss was minimal. Procedure:                Pre-Anesthesia Assessment:                           - The anesthesia plan was to use monitored                            anesthesia care (MAC).                           After obtaining informed consent, the endoscope was                            passed under direct vision. Throughout the                            procedure, the patient's blood pressure, pulse, and                            oxygen saturations were monitored continuously. The                            GIF-H190 ([id number]) scope was introduced through the                            mouth, and advanced to the second part of duodenum.                            The upper GI endoscopy was accomplished without                            difficulty. The patient tolerated the procedure                            well. Scope In:  9:54:50 AM Scope Out: 10:02:35 AM Total Procedure Duration: 0 hours 7 minutes 45 seconds  Findings:      A small hiatal hernia was present.      LA Grade C (one or more mucosal breaks continuous between tops of 2 or       more mucosal folds, less than 75% circumference) esophagitis with no       bleeding was found at the gastroesophageal junction.      A mild Schatzki ring was  found in the distal esophagus. A TTS dilator       was passed through the scope. Dilation with an 18-19-20 mm balloon       dilator was performed to 20 mm. The dilation site was examined and       showed mild mucosal disruption and moderate improvement in luminal       narrowing. Ring was then obliterated with cold forcep biopsies.      Localized mild inflammation characterized by erythema was found in the       gastric antrum. Biopsies were taken with a cold forceps for Helicobacter       pylori testing.      The duodenal bulb, first portion of the duodenum and second portion of       the duodenum were normal. Impression:               - Small hiatal hernia.                           - LA Grade C reflux esophagitis with no bleeding.                           - Mild Schatzki ring. Dilated.                           - Gastritis. Biopsied.                           - Normal duodenal bulb, first portion of the                            duodenum and second portion of the duodenum. Moderate Sedation:      Per Anesthesia Care Recommendation:           - Patient has a contact number available for                            emergencies. The signs and symptoms of potential                            delayed complications were discussed with the                            patient. Return to normal activities tomorrow.                            Written discharge instructions were provided to the                            patient.                           -  Resume previous diet.                           - Continue present medications.                           - Await pathology results.                           - Repeat upper endoscopy PRN for retreatment.                           - Return to GI clinic in 4 months.                           - Use Prilosec (omeprazole) 20 mg PO BID.                           - No ibuprofen, naproxen, or other non-steroidal                             anti-inflammatory drugs. Procedure Code(s):        --- Professional ---                           [phone], Esophagogastroduodenoscopy, flexible,                            transoral; with transendoscopic balloon dilation of                            esophagus (less than 30 mm diameter)                           43239, 59, Esophagogastroduodenoscopy, flexible,                            transoral; with biopsy, single or multiple Diagnosis Code(s):        --- Professional ---                           K44.9, Diaphragmatic hernia without obstruction or                            gangrene                           K21.00, Gastro-esophageal reflux disease with                            esophagitis, without bleeding                           K22.2, Esophageal obstruction                           K29.70, Gastritis, unspecified, without bleeding  R10.13, Epigastric pain                           R13.10, Dysphagia, unspecified CPT copyright 2019 American Medical Association. All rights reserved. The codes documented in this report are preliminary and upon coder review may  be revised to meet current compliance requirements. Elon Alas. Abbey Chatters, DO [place], DO 03/02/2021 10:06:01 AM This report has been signed electronically. Number of Addenda: 0

## 2021-03-02 NOTE — Discharge Instructions (Addendum)
EGD Discharge instructions Please read the instructions outlined below and refer to this sheet in the next few weeks. These discharge instructions provide you with general information on caring for yourself after you leave the hospital. Your doctor may also give you specific instructions. While your treatment has been planned according to the most current medical practices available, unavoidable complications occasionally occur. If you have any problems or questions after discharge, please call your doctor. ACTIVITY You may resume your regular activity but move at a slower pace for the next 24 hours.  Take frequent rest periods for the next 24 hours.  Walking will help expel (get rid of) the air and reduce the bloated feeling in your abdomen.  No driving for 24 hours (because of the anesthesia (medicine) used during the test).  You may shower.  Do not sign any important legal documents or operate any machinery for 24 hours (because of the anesthesia used during the test).  NUTRITION Drink plenty of fluids.  You may resume your normal diet.  Begin with a light meal and progress to your normal diet.  Avoid alcoholic beverages for 24 hours or as instructed by your caregiver.  MEDICATIONS You may resume your normal medications unless your caregiver tells you otherwise.  WHAT YOU CAN EXPECT TODAY You may experience abdominal discomfort such as a feeling of fullness or "gas" pains.  FOLLOW-UP Your doctor will discuss the results of your test with you.  SEEK IMMEDIATE MEDICAL ATTENTION IF ANY OF THE FOLLOWING OCCUR: Excessive nausea (feeling sick to your stomach) and/or vomiting.  Severe abdominal pain and distention (swelling).  Trouble swallowing.  Temperature over 101 F (37.8 C).  Rectal bleeding or vomiting of blood.    Your EGD revealed significant inflammation in your esophagus consistent with reflux esophagitis.  You also had a tightening of your esophagus which I stretched with a  balloon.  Mild amount inflammation in your stomach.  I took biopsies of this to rule out infection with a bacteria called H. pylori.  Await pathology results, my office will contact you.  I am going to start a new medication called omeprazole 20 mg twice daily.  This will take time to heal but your symptoms should improve.  Follow-up with GI in 4 months.  It was nice meeting you today.   I hope you have a great rest of your week!  Elon Alas. Abbey Chatters, D.O. Gastroenterology and Hepatology [facility]

## 2021-03-02 NOTE — Anesthesia Preprocedure Evaluation (Signed)
Anesthesia Evaluation  Patient identified by MRN, date of birth, ID band Patient awake    Reviewed: Allergy & Precautions, NPO status , Patient's Chart, lab work & pertinent test results  History of Anesthesia Complications Negative for: history of anesthetic complications  Airway Mallampati: II  TM Distance: >3 FB Neck ROM: Full    Dental  (+) Teeth Intact, Dental Advisory Given   Pulmonary asthma , neg COPD,  COPD inhaler,    Pulmonary exam normal breath sounds clear to auscultation       Cardiovascular hypertension, Pt. on medications Normal cardiovascular exam Rhythm:Regular Rate:Normal     Neuro/Psych negative neurological ROS  negative psych ROS   GI/Hepatic Neg liver ROS, GERD  ,  Endo/Other  diabetes, Well Controlled, Type 2, Oral Hypoglycemic Agents  Renal/GU negative Renal ROS     Musculoskeletal negative musculoskeletal ROS (+)   Abdominal   Peds  Hematology negative hematology ROS (+)   Anesthesia Other Findings   Reproductive/Obstetrics negative OB ROS                             Anesthesia Physical Anesthesia Plan  ASA: 2  Anesthesia Plan: General   Post-op Pain Management:    Induction: Intravenous  PONV Risk Score and Plan: TIVA  Airway Management Planned: Nasal Cannula and Natural Airway  Additional Equipment:   Intra-op Plan:   Post-operative Plan:   Informed Consent: I have reviewed the patients History and Physical, chart, labs and discussed the procedure including the risks, benefits and alternatives for the proposed anesthesia with the patient or authorized representative who has indicated his/her understanding and acceptance.     Dental advisory given  Plan Discussed with: CRNA and Surgeon  Anesthesia Plan Comments:         Anesthesia Quick Evaluation

## 2021-03-02 NOTE — Anesthesia Postprocedure Evaluation (Signed)
Anesthesia Post Note  Patient: Taylor Cohen  Procedure(s) Performed: ESOPHAGOGASTRODUODENOSCOPY (EGD) WITH PROPOFOL BALLOON DILATION BIOPSY  Patient location during evaluation: Endoscopy Anesthesia Type: General Level of consciousness: awake and alert and oriented Pain management: pain level controlled Vital Signs Assessment: post-procedure vital signs reviewed and stable Respiratory status: spontaneous breathing and respiratory function stable Cardiovascular status: blood pressure returned to baseline and stable Postop Assessment: no apparent nausea or vomiting Anesthetic complications: no   No notable events documented.   Last Vitals:  Vitals:   03/02/21 0838 03/02/21 1007  BP: (!) 149/89 (!) 121/50  Pulse:  68  Resp:  (!) 21  Temp:  36.5 C  SpO2:  97%    Last Pain:  Vitals:   03/02/21 1007  TempSrc: Oral  PainSc: 0-No pain                 Lindie Roberson C Jeanita Carneiro

## 2021-03-02 NOTE — Interval H&P Note (Signed)
History and Physical Interval Note:  03/02/2021 9:44 AM  Taylor Cohen  has presented today for surgery, with the diagnosis of dysphagia.  The various methods of treatment have been discussed with the patient and family. After consideration of risks, benefits and other options for treatment, the patient has consented to  Procedure(s) with comments: ESOPHAGOGASTRODUODENOSCOPY (EGD) WITH PROPOFOL (N/A) - 10:00am BALLOON DILATION (N/A) as a surgical intervention.  The patient's history has been reviewed, patient examined, no change in status, stable for surgery.  I have reviewed the patient's chart and labs.  Questions were answered to the patient's satisfaction.     Eloise Harman

## 2021-03-03 LAB — SURGICAL PATHOLOGY

## 2021-03-05 ENCOUNTER — Encounter (HOSPITAL_COMMUNITY): Payer: Self-pay | Admitting: Internal Medicine

## 2021-03-05 ENCOUNTER — Other Ambulatory Visit: Payer: Self-pay

## 2021-03-05 DIAGNOSIS — K2970 Gastritis, unspecified, without bleeding: Secondary | ICD-10-CM

## 2021-03-05 DIAGNOSIS — B9681 Helicobacter pylori [H. pylori] as the cause of diseases classified elsewhere: Secondary | ICD-10-CM

## 2021-03-05 MED ORDER — AMOXICILL-CLARITHRO-LANSOPRAZ PO MISC: Freq: Two times a day (BID) | ORAL | 0 refills | Status: DC

## 2021-04-07 ENCOUNTER — Encounter (HOSPITAL_COMMUNITY): Payer: Self-pay | Admitting: Emergency Medicine

## 2021-04-07 ENCOUNTER — Emergency Department (HOSPITAL_COMMUNITY): Payer: 59

## 2021-04-07 ENCOUNTER — Emergency Department (HOSPITAL_COMMUNITY)
Admission: EM | Admit: 2021-04-07 | Discharge: 2021-04-08 | Disposition: A | Discharge status: Home / Self Care | Payer: 59 | Attending: Emergency Medicine | Admitting: Emergency Medicine

## 2021-04-07 DIAGNOSIS — Z853 Personal history of malignant neoplasm of breast: Secondary | ICD-10-CM | POA: Diagnosis not present

## 2021-04-07 DIAGNOSIS — R0602 Shortness of breath: Secondary | ICD-10-CM | POA: Diagnosis present

## 2021-04-07 DIAGNOSIS — E1165 Type 2 diabetes mellitus with hyperglycemia: Secondary | ICD-10-CM | POA: Insufficient documentation

## 2021-04-07 DIAGNOSIS — J4541 Moderate persistent asthma with (acute) exacerbation: Secondary | ICD-10-CM | POA: Diagnosis not present

## 2021-04-07 DIAGNOSIS — R000 Tachycardia, unspecified: Secondary | ICD-10-CM | POA: Insufficient documentation

## 2021-04-07 DIAGNOSIS — Z20822 Contact with and (suspected) exposure to covid-19: Secondary | ICD-10-CM | POA: Insufficient documentation

## 2021-04-07 DIAGNOSIS — Z7984 Long term (current) use of oral hypoglycemic drugs: Secondary | ICD-10-CM | POA: Insufficient documentation

## 2021-04-07 DIAGNOSIS — I10 Essential (primary) hypertension: Secondary | ICD-10-CM | POA: Diagnosis not present

## 2021-04-07 DIAGNOSIS — J45909 Unspecified asthma, uncomplicated: Secondary | ICD-10-CM | POA: Insufficient documentation

## 2021-04-07 DIAGNOSIS — Z79899 Other long term (current) drug therapy: Secondary | ICD-10-CM | POA: Insufficient documentation

## 2021-04-07 DIAGNOSIS — R739 Hyperglycemia, unspecified: Secondary | ICD-10-CM

## 2021-04-07 MED ORDER — ALBUTEROL SULFATE HFA 108 (90 BASE) MCG/ACT IN AERS: 2.0000 | INHALATION_SPRAY | RESPIRATORY_TRACT | Status: DC | PRN

## 2021-04-07 NOTE — ED Triage Notes (Signed)
Pt arrives with [facility] from home c/o increased SOB. Pt dx with RSV 11/11. Pt given 125mg  solumedrol, 2.5mg  albuterol, and 522ml NS by EMS.

## 2021-04-08 LAB — CBC WITH DIFFERENTIAL/PLATELET
Abs Immature Granulocytes: 0.09 10*3/uL — ABNORMAL HIGH (ref 0.00–0.07)
Basophils Absolute: 0 10*3/uL (ref 0.0–0.1)
Basophils Relative: 0 %
Eosinophils Absolute: 0 10*3/uL (ref 0.0–0.5)
Eosinophils Relative: 0 %
HCT: 38.2 % (ref 36.0–46.0)
Hemoglobin: 12.5 g/dL (ref 12.0–15.0)
Immature Granulocytes: 1 %
Lymphocytes Relative: 21 %
Lymphs Abs: 1.7 10*3/uL (ref 0.7–4.0)
MCH: 27.8 pg (ref 26.0–34.0)
MCHC: 32.7 g/dL (ref 30.0–36.0)
MCV: 84.9 fL (ref 80.0–100.0)
Monocytes Absolute: 0.7 10*3/uL (ref 0.1–1.0)
Monocytes Relative: 9 %
Neutro Abs: 5.7 10*3/uL (ref 1.7–7.7)
Neutrophils Relative %: 69 %
Platelets: 275 10*3/uL (ref 150–400)
RBC: 4.5 MIL/uL (ref 3.87–5.11)
RDW: 14.1 % (ref 11.5–15.5)
WBC: 8.2 10*3/uL (ref 4.0–10.5)
nRBC: 0 % (ref 0.0–0.2)

## 2021-04-08 LAB — BASIC METABOLIC PANEL
Anion gap: 13 (ref 5–15)
BUN: 31 mg/dL — ABNORMAL HIGH (ref 8–23)
CO2: 22 mmol/L (ref 22–32)
Calcium: 8.9 mg/dL (ref 8.9–10.3)
Chloride: 98 mmol/L (ref 98–111)
Creatinine, Ser: 0.82 mg/dL (ref 0.44–1.00)
GFR, Estimated: >60 mL/min (ref >60)
Glucose, Bld: 305 mg/dL — ABNORMAL HIGH (ref 70–99)
Potassium: 3.4 mmol/L — ABNORMAL LOW (ref 3.5–5.1)
Sodium: 133 mmol/L — ABNORMAL LOW (ref 135–145)

## 2021-04-08 LAB — RESP PANEL BY RT-PCR (FLU A&B, COVID) ARPGX2
Influenza A by PCR: NEGATIVE
Influenza B by PCR: NEGATIVE
SARS Coronavirus 2 by RT PCR: NEGATIVE

## 2021-04-08 MED ORDER — IPRATROPIUM BROMIDE 0.02 % IN SOLN
0.5000 mg | Freq: Once | RESPIRATORY_TRACT | Status: AC
  Administered 2021-04-08: 0.5 mg via RESPIRATORY_TRACT
  Filled 2021-04-08: qty 2.5

## 2021-04-08 MED ORDER — ALBUTEROL SULFATE (2.5 MG/3ML) 0.083% IN NEBU
5.0000 mg | INHALATION_SOLUTION | Freq: Once | RESPIRATORY_TRACT | Status: AC
  Administered 2021-04-08: 5 mg via RESPIRATORY_TRACT
  Filled 2021-04-08: qty 6

## 2021-04-08 MED ORDER — BENZONATATE 100 MG PO CAPS: 100.0000 mg | ORAL_CAPSULE | Freq: Three times a day (TID) | ORAL | 0 refills | Status: DC

## 2021-04-08 MED ORDER — BENZONATATE 100 MG PO CAPS
100.0000 mg | ORAL_CAPSULE | Freq: Once | ORAL | Status: AC
  Administered 2021-04-08: 02:00:00 100 mg via ORAL
  Filled 2021-04-08: qty 1

## 2021-04-08 MED ORDER — ALBUTEROL SULFATE (2.5 MG/3ML) 0.083% IN NEBU
5.0000 mg | INHALATION_SOLUTION | Freq: Once | RESPIRATORY_TRACT | Status: AC
  Administered 2021-04-08: 03:00:00 5 mg via RESPIRATORY_TRACT
  Filled 2021-04-08: qty 6

## 2021-04-08 NOTE — ED Provider Notes (Signed)
[facility] EMERGENCY DEPARTMENT Provider Note   CSN: [id number] Arrival date & time: 04/07/21  2319     History Chief Complaint  Patient presents with   Shortness of Breath    Taylor Cohen is a 65 y.o. female.  The history is provided by the patient.  Shortness of Breath Severity:  Moderate Onset quality:  Gradual Duration:  5 days Timing:  Intermittent Progression:  Worsening Chronicity:  New Relieved by:  Nothing Worsened by:  Nothing Associated symptoms: cough and wheezing   Associated symptoms: no chest pain, no fever, no hemoptysis and no vomiting   Patient history of asthma, hypertension presents with shortness of breath.  She reports she was diagnosed with RSV virus last week.  She reports since that time she been having increasing cough and shortness of breath.  She reports that the dry cough and she is not having any sputum.  No fevers or vomiting. She was brought by EMS and was given nebulized therapies and steroids    Past Medical History:  Diagnosis Date   Asthma    Breast cancer ([place])    Around 2004-2005. Left; s/p mastectomy and chemo   Diabetes ([place])    HLD (hyperlipidemia)    HTN (hypertension)     Patient Active Problem List   Diagnosis Date Noted   Dysphagia 02/15/2021    Past Surgical History:  Procedure Laterality Date   BALLOON DILATION N/A 03/02/2021   Procedure: BALLOON DILATION;  Surgeon: Eloise Harman, DO;  [facility];  Service: Endoscopy;  Laterality: N/A;   BIOPSY  03/02/2021   Procedure: BIOPSY;  Surgeon: Eloise Harman, DO;  [facility];  Service: Endoscopy;;   COLONOSCOPY     [place]   ESOPHAGOGASTRODUODENOSCOPY (EGD) WITH PROPOFOL N/A 03/02/2021   Procedure: ESOPHAGOGASTRODUODENOSCOPY (EGD) WITH PROPOFOL;  Surgeon: Eloise Harman, DO;  [facility];  Service: Endoscopy;  Laterality: N/A;  10:00am   MASTECTOMY Left    2004 or 2005   TUBAL LIGATION       OB History   No  obstetric history on file.     Family History  Problem Relation Age of Onset   Colon cancer Neg Hx    Stomach cancer Neg Hx    Esophageal cancer Neg Hx     Social History   Tobacco Use   Smoking status: Never   Smokeless tobacco: Never  Substance Use Topics   Alcohol use: Never   Drug use: Never    Home Medications Prior to Admission medications   Medication Sig Start Date End Date Taking? Authorizing Provider  amoxicillin-clarithromycin-lansoprazole [facility]) combo pack Take by mouth 2 (two) times daily for 14 days. Follow package directions. 03/05/21 03/19/21  Eloise Harman, DO  benzonatate (TESSALON) 100 MG capsule Take 1 capsule (100 mg total) by mouth every 8 (eight) hours. 04/08/21  Yes Ripley Fraise, MD  albuterol (PROVENTIL) (2.5 MG/3ML) 0.083% nebulizer solution Take 2.5 mg by nebulization every 6 (six) hours as needed for wheezing or shortness of breath.    [provider]  albuterol (VENTOLIN HFA) 108 (90 Base) MCG/ACT inhaler Inhale 1-2 puffs into the lungs every 6 (six) hours as needed for wheezing or shortness of breath. 01/05/21   [provider]  amLODipine (NORVASC) 10 MG tablet Take 10 mg by mouth in the morning. 12/16/20   [provider]  Calcium Carb-Cholecalciferol (CALCIUM 600+D3 PO) Take 1 tablet by mouth in the morning.  [provider]  citalopram (CELEXA) 20 MG tablet Take 20 mg by mouth in the morning. 12/16/20   [provider]  cycloSPORINE (RESTASIS) 0.05 % ophthalmic emulsion Place 1 drop into both eyes 2 (two) times daily as needed (dry/irritated eyes).    [provider]  glipiZIDE (GLUCOTROL XL) 5 MG 24 hr tablet Take 5 mg by mouth daily. 01/18/21   [provider]  hydrochlorothiazide (HYDRODIURIL) 25 MG tablet Take 25 mg by mouth in the morning. 12/16/20   [provider]  lisinopril (ZESTRIL) 20 MG tablet Take 20 mg by mouth in the morning. 01/13/20   [provider]  metFORMIN (GLUCOPHAGE) 1000 MG tablet Take 1,000 mg by mouth every morning. 12/16/20   [provider]  Multiple Vitamin (MULTIVITAMIN) tablet Take 1 tablet by mouth daily.    [provider]  omega-3 acid ethyl esters (LOVAZA) 1 g capsule Take 1 capsule by mouth 2 (two) times daily. 12/16/20   [provider]  omeprazole (PRILOSEC) 20 MG capsule Take 1 capsule (20 mg total) by mouth 2 (two) times daily before a meal. Take 30 min before breakfast and 30 min before dinner 03/02/21 08/29/21  Eloise Harman, DO  pravastatin (PRAVACHOL) 40 MG tablet Take 40 mg by mouth daily. 12/10/20   [provider]    Allergies    Patient has no known allergies.  Review of Systems   Review of Systems  Constitutional:  Negative for fever.  Respiratory:  Positive for cough, shortness of breath and wheezing. Negative for hemoptysis.   Cardiovascular:  Negative for chest pain.  Gastrointestinal:  Negative for vomiting.  All other systems reviewed and are negative.  Physical Exam Updated Vital Signs BP 135/75   Pulse (!) 102   Temp 98.8 F (37.1 C) (Oral)   Resp (!) 22   Ht 1.626 m (5\' 4" )   Wt 78.9 kg   SpO2 93%   BMI 29.87 kg/m   Physical Exam CONSTITUTIONAL: Elderly and anxious HEAD: Normocephalic/atraumatic EYES: EOMI/PERRL ENMT: Mucous membranes moist, no stridor NECK: supple no meningeal signs SPINE/BACK:entire spine nontender CV: S1/S2 noted, no murmurs/rubs/gallops noted LUNGS: Decreased breath sounds bilaterally, scattered wheeze noted ABDOMEN: soft, nontender NEURO: Pt is awake/alert/appropriate, moves all extremitiesx4.  No facial droop.   EXTREMITIES: pulses normal/equal, full ROM, no lower extremity edema SKIN: warm, color normal PSYCH: Anxious  ED Results / Procedures / Treatments   Labs (all labs ordered are listed, but only abnormal results are displayed) Labs Reviewed  BASIC METABOLIC PANEL - Abnormal; Notable for the  following components:      Result Value   Sodium 133 (*)    Potassium 3.4 (*)    Glucose, Bld 305 (*)    BUN 31 (*)    All other components within normal limits  CBC WITH DIFFERENTIAL/PLATELET - Abnormal; Notable for the following components:   Abs Immature Granulocytes 0.09 (*)    All other components within normal limits  RESP PANEL BY RT-PCR (FLU A&B, COVID) [id number]    EKG EKG Interpretation  Date/Time:  Wednesday April 07 2021 23:34:12 EST Ventricular Rate:  102 PR Interval:  139 QRS Duration: 99 QT Interval:  358 QTC Calculation: 467 R Axis:   71 Text Interpretation: Sinus tachycardia Borderline T abnormalities, inferior leads Baseline wander in lead(s) V2 Interpretation limited secondary to artifact No previous ECGs available Confirmed by Ripley Fraise [id number]) on 04/07/2021 11:36:52 PM  Radiology DG Chest 2 View  Result Date: 04/07/2021  CLINICAL DATA:  Shortness of breath, with history of RSV EXAM: CHEST - 2 VIEW COMPARISON:  04/03/2021 FINDINGS: Cardiac shadow is within normal limits. Lungs are well aerated bilaterally. No focal infiltrate or effusion is seen. Postsurgical changes in the left axilla are noted. IMPRESSION: No acute abnormality noted. Electronically Signed   By: Inez Catalina M.D.   On: 04/07/2021 23:49    Procedures Procedures   Medications Ordered in ED Medications  albuterol (PROVENTIL) (2.5 MG/3ML) 0.083% nebulizer solution 5 mg (5 mg Nebulization Given 04/08/21 0041)  ipratropium (ATROVENT) nebulizer solution 0.5 mg (0.5 mg Nebulization Given 04/08/21 0041)  albuterol (PROVENTIL) (2.5 MG/3ML) 0.083% nebulizer solution 5 mg (5 mg Nebulization Given 04/08/21 0238)  benzonatate (TESSALON) capsule 100 mg (100 mg Oral Given 04/08/21 0226)    ED Course  I have reviewed the triage vital signs and the nursing notes.  Pertinent labs & imaging results that were available during my care of the patient were reviewed by me and considered in my medical  decision making (see chart for details).    MDM Rules/Calculators/A&P                           Patient presents with cough, shortness of breath and wheezing.  She has history of asthma.  Per records, she was seen on November 12 at [facility] and given a Z-Pak and prednisone.  She was diagnosed with RSV last month.  Here in the emergency department she appears to be improving.  Her x-Hickson has been reviewed personally and it is negative 3:55 AM Patient is much improved.  No hypoxia.  Her wheezing is resolving.  She is in no acute distress. She denies any chest pain.  No signs of CHF or pneumonia.  Low suspicion for PE. She still has prednisone and her Z-Pak at home.  She will finish these medications.  Advised use albuterol at home every 4 hours. Will add on Tessalon Perles. Patient likely had asthma exacerbation that is resolving Patient also noted to have hyperglycemia without anion gap Final Clinical Impression(s) / ED Diagnoses Final diagnoses:  Moderate persistent asthma with exacerbation  Hyperglycemia    Rx / DC Orders ED Discharge Orders          Ordered    benzonatate (TESSALON) 100 MG capsule  Every 8 hours        04/08/21 0355             Ripley Fraise, MD 04/08/21 [id number]

## 2021-06-17 DIAGNOSIS — Z1231 Encounter for screening mammogram for malignant neoplasm of breast: Secondary | ICD-10-CM | POA: Diagnosis not present

## 2021-06-22 DIAGNOSIS — Z0000 Encounter for general adult medical examination without abnormal findings: Secondary | ICD-10-CM | POA: Diagnosis not present

## 2021-06-22 DIAGNOSIS — I10 Essential (primary) hypertension: Secondary | ICD-10-CM | POA: Diagnosis not present

## 2021-06-22 DIAGNOSIS — E1169 Type 2 diabetes mellitus with other specified complication: Secondary | ICD-10-CM | POA: Diagnosis not present

## 2021-06-22 DIAGNOSIS — M25552 Pain in left hip: Secondary | ICD-10-CM | POA: Diagnosis not present

## 2021-06-22 DIAGNOSIS — J4541 Moderate persistent asthma with (acute) exacerbation: Secondary | ICD-10-CM | POA: Diagnosis not present

## 2021-06-24 DIAGNOSIS — M25552 Pain in left hip: Secondary | ICD-10-CM | POA: Diagnosis not present

## 2021-06-24 DIAGNOSIS — M160 Bilateral primary osteoarthritis of hip: Secondary | ICD-10-CM | POA: Diagnosis not present

## 2021-07-01 ENCOUNTER — Ambulatory Visit (INDEPENDENT_AMBULATORY_CARE_PROVIDER_SITE_OTHER): Payer: Medicare Other | Admitting: Orthopaedic Surgery

## 2021-07-01 ENCOUNTER — Other Ambulatory Visit: Payer: Self-pay

## 2021-07-01 ENCOUNTER — Encounter: Payer: Self-pay | Admitting: Orthopaedic Surgery

## 2021-07-01 DIAGNOSIS — M1612 Unilateral primary osteoarthritis, left hip: Secondary | ICD-10-CM | POA: Diagnosis not present

## 2021-07-01 NOTE — Progress Notes (Signed)
Office Visit Note   Patient: Taylor Cohen           Date of Birth: 05-Aug-1955           MRN: [id number] Visit Date: 07/01/2021              Requested by: Neale Burly, MD [place],  [place] [id number] PCP: Neale Burly, MD   Assessment & Plan: Visit Diagnoses:  1. Unilateral primary osteoarthritis, left hip     Plan: Patient has severe left hip osteoarthritis greater than a year symptoms failed anti-inflammatories ibuprofen, prednisone Dosepak.  She has subchondral sclerosis marginal osteophytes and no joint space with severe left hip osteoarthritis.  We discussed total hip arthroplasty patient states she like to proceed since she is having severe pain.  We discussed spinal anesthesia overnight observation.  Direct anterior approach discussed.  Questions were elicited and answered.  She understands request to proceed.  Follow-Up Instructions: No follow-ups on file.   Orders:  No orders of the defined types were placed in this encounter.  No orders of the defined types were placed in this encounter.     Procedures: No procedures performed   Clinical Data: No additional findings.   Subjective: Chief Complaint  Patient presents with   Left Hip - Pain    HPI 66 year old female with left hip severe osteoarthritis progressive greater than a year.  She was referred by Dr.Hasanaj for left total hip arthroplasty.  She states her pain is a 9 she is a diabetic last A1c was 6.6.  She has been on ibuprofen.  Pain is progressed to the point where she is having difficulty even ambulating in the house.  Pains in the left groin right groin pain.  Review of Systems positive for diabetes last A1c 6.6.  Patient is on oral medication.  Negative for cardiovascular disease no history of stroke irregularity.  Positive for hypertension all systems noncontributory to HPI.   Objective: Vital Signs: Ht 5\' 3"  (1.6 m)    Wt 172 lb (78 kg)    BMI 30.47 kg/m   Physical  Exam Constitutional:      Appearance: She is well-developed.  HENT:     Head: Normocephalic.     Right Ear: External ear normal.     Left Ear: External ear normal. There is no impacted cerumen.  Eyes:     Pupils: Pupils are equal, round, and reactive to light.  Neck:     Thyroid: No thyromegaly.     Trachea: No tracheal deviation.  Cardiovascular:     Rate and Rhythm: Normal rate.  Pulmonary:     Effort: Pulmonary effort is normal.  Abdominal:     Palpations: Abdomen is soft.  Musculoskeletal:     Cervical back: No rigidity.  Skin:    General: Skin is warm and dry.  Neurological:     Mental Status: She is alert and oriented to person, place, and time.  Psychiatric:        Behavior: Behavior normal.    Ortho Exam 0 degrees internal rotation left hip 10 degrees hip flexion contracture.  Left hip actually rotates 30 degrees with pain she is amatory with a pronounced Trendelenburg gait.  Knee range of motion is full distal pulses are 2+ and symmetrical opposite right hip past 30 degrees internal/external rotation without pain no hip flexion contracture.  Anterior tib EHL is normal no sciatic notch tenderness.  Specialty Comments:  No specialty  comments available.  Imaging: Lovena Neighbours, MD - 06/25/2021  Formatting of this note might be different from the original.  CLINICAL DATA:  Left hip pain   EXAM:  DG HIP (WITH OR WITHOUT PELVIS) 2-3V LEFT   COMPARISON:  None.   FINDINGS:  Normal alignment. No acute fracture or dislocation. There is  moderate right and moderate to severe left hip degenerative  arthritis with asymmetric joint space narrowing of the left hip and  subchondral cyst formation both within the femoral head and  acetabulum. Soft tissues are unremarkable.   IMPRESSION:  Bilateral degenerative hip arthritis, more severe on the left.    Electronically Signed    By: Fidela Salisbury M.D.    On: 06/25/2021 23:21   PMFS History: Patient Active  Problem List   Diagnosis Date Noted   Unilateral primary osteoarthritis, left hip 07/01/2021   Dysphagia 02/15/2021   Past Medical History:  Diagnosis Date   Asthma    Breast cancer ([place])    Around 2004-2005. Left; s/p mastectomy and chemo   Diabetes ([place])    HLD (hyperlipidemia)    HTN (hypertension)     Family History  Problem Relation Age of Onset   Colon cancer Neg Hx    Stomach cancer Neg Hx    Esophageal cancer Neg Hx     Past Surgical History:  Procedure Laterality Date   BALLOON DILATION N/A 03/02/2021   Procedure: BALLOON DILATION;  Surgeon: Eloise Harman, DO;  [facility];  Service: Endoscopy;  Laterality: N/A;   BIOPSY  03/02/2021   Procedure: BIOPSY;  Surgeon: Eloise Harman, DO;  [facility];  Service: Endoscopy;;   COLONOSCOPY     [place]   ESOPHAGOGASTRODUODENOSCOPY (EGD) WITH PROPOFOL N/A 03/02/2021   Procedure: ESOPHAGOGASTRODUODENOSCOPY (EGD) WITH PROPOFOL;  Surgeon: Eloise Harman, DO;  [facility];  Service: Endoscopy;  Laterality: N/A;  10:00am   MASTECTOMY Left    2004 or 2005   TUBAL LIGATION     Social History   Occupational History   Not on file  Tobacco Use   Smoking status: Never   Smokeless tobacco: Never  Substance and Sexual Activity   Alcohol use: Never   Drug use: Never   Sexual activity: Not on file

## 2021-07-19 NOTE — Progress Notes (Deleted)
? ? ?Referring Provider: Neale Burly, MD ?Primary Care Physician:  Neale Burly, MD ?Primary GI Physician: Dr. Abbey Chatters ? ?No chief complaint on file. ? ? ?HPI:   ?Taylor Cohen is a 66 y.o. female presenting today for follow-up of solid food and pill dysphagia s/p EGD completed 03/02/2021 revealing small hiatal hernia, grade C reflux esophagitis, mild Schatzki's ring dilated, gastritis biopsied.  Recommended omeprazole 20 mg twice daily.  Her pathology came back with gastric biopsy positive for H. pylori, esophageal biopsy consistent with reflux changes.  Recommended treatment with Prevpac, hold current PPI until Prevpac completed, then resume PPI therapy. ? ?Patient was seen in the emergency room on 03/15/2021 for angioedema.  Suspected the culprit was lisinopril, but antibiotics for H. pylori were also discontinued. ? ?Today: ? ? ?Past Medical History:  ?Diagnosis Date  ? Asthma   ? Breast cancer ([place])   ? Around 2004-2005. Left; s/p mastectomy and chemo  ? Diabetes ([place])   ? HLD (hyperlipidemia)   ? HTN (hypertension)   ? ? ?Past Surgical History:  ?Procedure Laterality Date  ? BALLOON DILATION N/A 03/02/2021  ? Procedure: BALLOON DILATION;  Surgeon: Eloise Harman, DO;  [facility];  Service: Endoscopy;  Laterality: N/A;  ? BIOPSY  03/02/2021  ? Procedure: BIOPSY;  Surgeon: Eloise Harman, DO;  [facility];  Service: Endoscopy;;  ? COLONOSCOPY    ? [place]  ? ESOPHAGOGASTRODUODENOSCOPY (EGD) WITH PROPOFOL N/A 03/02/2021  ? Procedure: ESOPHAGOGASTRODUODENOSCOPY (EGD) WITH PROPOFOL;  Surgeon: Eloise Harman, DO;  [facility];  Service: Endoscopy;  Laterality: N/A;  10:00am  ? MASTECTOMY Left   ? 2004 or 2005  ? TUBAL LIGATION    ? ? ?Current Outpatient Medications  ?Medication Sig Dispense Refill  ? amoxicillin-clarithromycin-lansoprazole (PREVPAC) combo pack Take by mouth 2 (two) times daily for 14 days. Follow package directions. 1 each 0  ? albuterol  (PROVENTIL) (2.5 MG/3ML) 0.083% nebulizer solution Take 2.5 mg by nebulization every 6 (six) hours as needed for wheezing or shortness of breath.    ? albuterol (VENTOLIN HFA) 108 (90 Base) MCG/ACT inhaler Inhale 1-2 puffs into the lungs every 6 (six) hours as needed for wheezing or shortness of breath.    ? amLODipine (NORVASC) 10 MG tablet Take 10 mg by mouth in the morning.    ? benzonatate (TESSALON) 100 MG capsule Take 1 capsule (100 mg total) by mouth every 8 (eight) hours. 21 capsule 0  ? Calcium Carb-Cholecalciferol (CALCIUM 600+D3 PO) Take 1 tablet by mouth in the morning.    ? citalopram (CELEXA) 20 MG tablet Take 20 mg by mouth in the morning.    ? cycloSPORINE (RESTASIS) 0.05 % ophthalmic emulsion Place 1 drop into both eyes 2 (two) times daily as needed (dry/irritated eyes).    ? FARXIGA 10 MG TABS tablet Take 10 mg by mouth every morning.    ? glipiZIDE (GLUCOTROL XL) 5 MG 24 hr tablet Take 5 mg by mouth daily.    ? hydrochlorothiazide (HYDRODIURIL) 25 MG tablet Take 25 mg by mouth in the morning.    ? lisinopril (ZESTRIL) 20 MG tablet Take 20 mg by mouth in the morning. (Patient not taking: Reported on 07/01/2021)    ? metFORMIN (GLUCOPHAGE) 1000 MG tablet Take 1,000 mg by mouth every morning.    ? Multiple Vitamin (MULTIVITAMIN) tablet Take 1 tablet by mouth daily.    ? omega-3 acid ethyl esters (LOVAZA) 1 g  capsule Take 1 capsule by mouth 2 (two) times daily.    ? omeprazole (PRILOSEC) 20 MG capsule Take 1 capsule (20 mg total) by mouth 2 (two) times daily before a meal. Take 30 min before breakfast and 30 min before dinner 60 capsule 5  ? pravastatin (PRAVACHOL) 40 MG tablet Take 40 mg by mouth daily.    ? ?No current facility-administered medications for this visit.  ? ? ?Allergies as of 07/21/2021 - Review Complete 07/01/2021  ?Allergen Reaction Noted  ? Lisinopril Swelling 03/16/2021  ? ? ?Family History  ?Problem Relation Age of Onset  ? Colon cancer Neg Hx   ? Stomach cancer Neg Hx   ?  Esophageal cancer Neg Hx   ? ? ?Social History  ? ?Socioeconomic History  ? Marital status: Married  ?  Spouse name: Not on file  ? Number of children: Not on file  ? Years of education: Not on file  ? Highest education level: Not on file  ?Occupational History  ? Not on file  ?Tobacco Use  ? Smoking status: Never  ? Smokeless tobacco: Never  ?Substance and Sexual Activity  ? Alcohol use: Never  ? Drug use: Never  ? Sexual activity: Not on file  ?Other Topics Concern  ? Not on file  ?Social History Narrative  ? Not on file  ? ?Social Determinants of Health  ? ?Financial Resource Strain: Not on file  ?Food Insecurity: Not on file  ?Transportation Needs: Not on file  ?Physical Activity: Not on file  ?Stress: Not on file  ?Social Connections: Not on file  ? ? ?Review of Systems: ?Gen: Denies fever, chills, anorexia. Denies fatigue, weakness, weight loss.  ?CV: Denies chest pain, palpitations, syncope, peripheral edema, and claudication. ?Resp: Denies dyspnea at rest, cough, wheezing, coughing up blood, and pleurisy. ?GI: Denies vomiting blood, jaundice, and fecal incontinence.   Denies dysphagia or odynophagia. ?Derm: Denies rash, itching, dry skin ?Psych: Denies depression, anxiety, memory loss, confusion. No homicidal or suicidal ideation.  ?Heme: Denies bruising, bleeding, and enlarged lymph nodes. ? ?Physical Exam: ?There were no vitals taken for this visit. ?General:   Alert and oriented. No distress noted. Pleasant and cooperative.  ?Head:  Normocephalic and atraumatic. ?Eyes:  Conjuctiva clear without scleral icterus. ?Mouth:  Oral mucosa pink and moist. Good dentition. No lesions. ?Heart:  S1, S2 present without murmurs appreciated. ?Lungs:  Clear to auscultation bilaterally. No wheezes, rales, or rhonchi. No distress.  ?Abdomen:  +BS, soft, non-tender and non-distended. No rebound or guarding. No HSM or masses noted. ?Msk:  Symmetrical without gross deformities. Normal posture. ?Extremities:  Without  edema. ?Neurologic:  Alert and  oriented x4 ?Psych:  Alert and cooperative. Normal mood and affect. ? ?

## 2021-07-21 ENCOUNTER — Ambulatory Visit: Payer: 59 | Admitting: Gastroenterology

## 2021-07-22 ENCOUNTER — Other Ambulatory Visit: Payer: Self-pay

## 2021-08-09 NOTE — Progress Notes (Signed)
Surgical Instructions ? ? ? Your procedure is scheduled on 08/18/21. ? Report to [facility] Main Entrance "A" at 10:30 A.M., then check in with the Admitting office. ? Call this number if you have problems the morning of surgery: ? [phone] ? ? If you have any questions prior to your surgery date call [phone]: Open Monday-Friday 8am-4pm ? ? ? Remember: ? Do not eat after midnight the night before your surgery ? ?You may drink clear liquids until 9:30am the morning of your surgery.   ?Clear liquids allowed are: Water, Non-Citrus Juices (without pulp), Carbonated Beverages, Clear Tea, Black Coffee ONLY (NO MILK, CREAM OR POWDERED CREAMER of any kind), and Gatorade ?  ? Take these medicines the morning of surgery with A SIP OF WATER:  ?amLODipine (NORVASC)  ?amoxicillin-clarithromycin-lansoprazole [facility])  ?benzonatate (TESSALON) ?citalopram (CELEXA) ?omeprazole (PRILOSEC) ?pravastatin (PRAVACHOL) ? ?IF NEEDED: ?albuterol (PROVENTIL) nebulizer and inhaler- bring inhaler with you the day of surgery ?cycloSPORINE (RESTASIS)  ? ?As of today, STOP taking any Aspirin (unless otherwise instructed by your surgeon) Aleve, Naproxen, Ibuprofen, Motrin, Advil, Goody's, BC's, all herbal medications, fish oil, and all vitamins. ? ?WHAT DO I DO ABOUT MY DIABETES MEDICATION? ? ? ?Do not take oral diabetes medicines (pills) the morning of surgery. ? ?THE DAY BEFORE SURGERY, do not take [place].  ? ?THE NIGHT BEFORE SURGERY, do not take glipiZIDE (GLUCOTROL XL). ? ?THE MORNING OF SURGERY, do not take FARXIGA, glipiZIDE (GLUCOTROL XL) or metFORMIN (GLUCOPHAGE). ? ?The day of surgery, do not take other diabetes injectables, including Byetta (exenatide), Bydureon (exenatide ER), Victoza (liraglutide), or Trulicity (dulaglutide). ? ?If your CBG is greater than 220 mg/dL, you may take ? of your sliding scale (correction) dose of insulin. ? ? ?HOW TO MANAGE YOUR DIABETES ?BEFORE AND AFTER SURGERY ? ?Why is it important to control  my blood sugar before and after surgery? ?Improving blood sugar levels before and after surgery helps healing and can limit problems. ?A way of improving blood sugar control is eating a healthy diet by: ? Eating less sugar and carbohydrates ? Increasing activity/exercise ? Talking with your doctor about reaching your blood sugar goals ?High blood sugars (greater than 180 mg/dL) can raise your risk of infections and slow your recovery, so you will need to focus on controlling your diabetes during the weeks before surgery. ?Make sure that the doctor who takes care of your diabetes knows about your planned surgery including the date and location. ? ?How do I manage my blood sugar before surgery? ?Check your blood sugar at least 4 times a day, starting 2 days before surgery, to make sure that the level is not too high or low. ? ?Check your blood sugar the morning of your surgery when you wake up and every 2 hours until you get to the Short Stay unit. ? ?If your blood sugar is less than 70 mg/dL, you will need to treat for low blood sugar: ?Do not take insulin. ?Treat a low blood sugar (less than 70 mg/dL) with ? cup of clear juice (cranberry or apple), 4 glucose tablets, OR glucose gel. ?Recheck blood sugar in 15 minutes after treatment (to make sure it is greater than 70 mg/dL). If your blood sugar is not greater than 70 mg/dL on recheck, call [phone] for further instructions. ?Report your blood sugar to the short stay nurse when you get to Short Stay. ? ?If you are admitted to the hospital after surgery: ?Your blood sugar will be checked by the staff  and you will probably be given insulin after surgery (instead of oral diabetes medicines) to make sure you have good blood sugar levels. ?The goal for blood sugar control after surgery is 80-180 mg/dL. ?     ?Do not wear jewelry or makeup ?Do not wear lotions, powders, perfumes/colognes, or deodorant. ?Do not shave 48 hours prior to surgery.   ?Do not bring valuables  to the hospital. ?Do not wear nail polish, gel polish, artificial nails, or any other type of covering on natural nails (fingers and toes) ?If you have artificial nails or gel coating that need to be removed by a nail salon, please have this removed prior to surgery. Artificial nails or gel coating may interfere with anesthesia's ability to adequately monitor your vital signs. ? ?[facility] is not responsible for any belongings or valuables. .  ? ?Do NOT Smoke (Tobacco/Vaping)  24 hours prior to your procedure ? ?If you use a CPAP at night, you may bring your mask for your overnight stay. ?  ?Contacts, glasses, hearing aids, dentures or partials may not be worn into surgery, please bring cases for these belongings ?  ?For patients admitted to the hospital, discharge time will be determined by your treatment team. ?  ?Patients discharged the day of surgery will not be allowed to drive home, and someone needs to stay with them for 24 hours. ? ?NO VISITORS WILL BE ALLOWED IN PRE-OP WHERE PATIENTS ARE PREPPED FOR SURGERY.  ONLY 1 SUPPORT PERSON MAY BE PRESENT IN THE WAITING ROOM WHILE YOU ARE IN SURGERY.  IF YOU ARE TO BE ADMITTED, ONCE YOU ARE IN YOUR ROOM YOU WILL BE ALLOWED TWO (2) VISITORS. 1 (ONE) VISITOR MAY STAY OVERNIGHT BUT MUST ARRIVE TO THE ROOM BY 8pm.  Minor children may have two parents present. Special consideration for safety and communication needs will be reviewed on a case by case basis. ? ?Special instructions:   ? ?Oral Hygiene is also important to reduce your risk of infection.  Remember - BRUSH YOUR TEETH THE MORNING OF SURGERY WITH YOUR REGULAR TOOTHPASTE ? ? ?[facility]- Preparing For Surgery ? ?Before surgery, you can play an important role. Because skin is not sterile, your skin needs to be as free of germs as possible. You can reduce the number of germs on your skin by washing with CHG (chlorahexidine gluconate) Soap before surgery.  CHG is an antiseptic cleaner which kills germs and bonds  with the skin to continue killing germs even after washing.   ? ? ?Please do not use if you have an allergy to CHG or antibacterial soaps. If your skin becomes reddened/irritated stop using the CHG.  ?Do not shave (including legs and underarms) for at least 48 hours prior to first CHG shower. It is OK to shave your face. ? ?Please follow these instructions carefully. ?  ? ? Shower the NIGHT BEFORE SURGERY and the MORNING OF SURGERY with CHG Soap.  ? If you chose to wash your hair, wash your hair first as usual with your normal shampoo. After you shampoo, rinse your hair and body thoroughly to remove the shampoo.  Then [employer] and genitals (private parts) with your normal soap and rinse thoroughly to remove soap. ? ?After that Use CHG Soap as you would any other liquid soap. You can apply CHG directly to the skin and wash gently with a scrungie or a clean washcloth.  ? ?Apply the CHG Soap to your body ONLY FROM THE NECK DOWN.  Do not use on open wounds or open sores. Avoid contact with your eyes, ears, mouth and genitals (private parts). Wash Face and genitals (private parts)  with your normal soap.  ? ?Wash thoroughly, paying special attention to the area where your surgery will be performed. ? ?Thoroughly rinse your body with warm water from the neck down. ? ?DO NOT shower/wash with your normal soap after using and rinsing off the CHG Soap. ? ?Pat yourself dry with a CLEAN TOWEL. ? ?Wear CLEAN PAJAMAS to bed the night before surgery ? ?Place CLEAN SHEETS on your bed the night before your surgery ? ?DO NOT SLEEP WITH PETS. ? ? ?Day of Surgery: ?Take a shower with CHG soap. ?Wear Clean/Comfortable clothing the morning of surgery ?Do not apply any deodorants/lotions.   ?Remember to brush your teeth WITH YOUR REGULAR TOOTHPASTE. ? ? ? ?COVID testing ? ?If you are going to stay overnight or be admitted after your procedure/surgery and require a pre-op COVID test, please follow these instructions after your COVID test   ? ?You are not required to quarantine however you are required to wear a well-fitting mask when you are out and around people not in your household.  If your mask becomes wet or soiled, replace with

## 2021-08-10 ENCOUNTER — Encounter (HOSPITAL_COMMUNITY): Payer: Self-pay

## 2021-08-10 ENCOUNTER — Encounter (HOSPITAL_COMMUNITY)
Admission: RE | Admit: 2021-08-10 | Discharge: 2021-08-10 | Disposition: A | Discharge status: Home / Self Care | Payer: Medicare Other | Source: Ambulatory Visit | Attending: Orthopaedic Surgery | Admitting: Orthopaedic Surgery

## 2021-08-10 ENCOUNTER — Other Ambulatory Visit: Payer: Self-pay

## 2021-08-10 ENCOUNTER — Ambulatory Visit (HOSPITAL_COMMUNITY)
Admission: RE | Admit: 2021-08-10 | Discharge: 2021-08-10 | Disposition: A | Discharge status: Home / Self Care | Payer: Medicare Other | Source: Ambulatory Visit | Attending: Surgery | Admitting: Surgery

## 2021-08-10 VITALS — BP 126/84 | HR 77 | Temp 98.3°F | Resp 17 | Ht 64.0 in | Wt 171.7 lb

## 2021-08-10 DIAGNOSIS — Z01818 Encounter for other preprocedural examination: Secondary | ICD-10-CM | POA: Diagnosis not present

## 2021-08-10 DIAGNOSIS — Z79899 Other long term (current) drug therapy: Secondary | ICD-10-CM | POA: Insufficient documentation

## 2021-08-10 HISTORY — DX: Dyspnea, unspecified: R06.00

## 2021-08-10 HISTORY — DX: Personal history of urinary calculi: Z87.442

## 2021-08-10 HISTORY — DX: Gastro-esophageal reflux disease without esophagitis: K21.9

## 2021-08-10 LAB — CBC
HCT: 42.5 % (ref 36.0–46.0)
Hemoglobin: 13.8 g/dL (ref 12.0–15.0)
MCH: 27.1 pg (ref 26.0–34.0)
MCHC: 32.5 g/dL (ref 30.0–36.0)
MCV: 83.3 fL (ref 80.0–100.0)
Platelets: 256 10*3/uL (ref 150–400)
RBC: 5.1 MIL/uL (ref 3.87–5.11)
RDW: 14.7 % (ref 11.5–15.5)
WBC: 5 10*3/uL (ref 4.0–10.5)
nRBC: 0 % (ref 0.0–0.2)

## 2021-08-10 LAB — SURGICAL PCR SCREEN
MRSA, PCR: NEGATIVE
Staphylococcus aureus: NEGATIVE

## 2021-08-10 LAB — HEMOGLOBIN A1C
Hgb A1c MFr Bld: 6.6 % — ABNORMAL HIGH (ref 4.8–5.6)
Mean Plasma Glucose: 142.72 mg/dL

## 2021-08-10 LAB — TYPE AND SCREEN
ABO/RH(D): A POS
Antibody Screen: NEGATIVE

## 2021-08-10 LAB — COMPREHENSIVE METABOLIC PANEL
ALT: 16 U/L (ref 0–44)
AST: 23 U/L (ref 15–41)
Albumin: 4.1 g/dL (ref 3.5–5.0)
Alkaline Phosphatase: 65 U/L (ref 38–126)
Anion gap: 10 (ref 5–15)
BUN: 18 mg/dL (ref 8–23)
CO2: 28 mmol/L (ref 22–32)
Calcium: 9.5 mg/dL (ref 8.9–10.3)
Chloride: 103 mmol/L (ref 98–111)
Creatinine, Ser: 0.85 mg/dL (ref 0.44–1.00)
GFR, Estimated: >60 mL/min (ref >60)
Glucose, Bld: 130 mg/dL — ABNORMAL HIGH (ref 70–99)
Potassium: 3.4 mmol/L — ABNORMAL LOW (ref 3.5–5.1)
Sodium: 141 mmol/L (ref 135–145)
Total Bilirubin: 0.6 mg/dL (ref 0.3–1.2)
Total Protein: 7.5 g/dL (ref 6.5–8.1)

## 2021-08-10 LAB — GLUCOSE, CAPILLARY: Glucose-Capillary: 161 mg/dL — ABNORMAL HIGH (ref 70–99)

## 2021-08-10 NOTE — Progress Notes (Signed)
Surgical Instructions ? ? ? Your procedure is scheduled on Wednesday, March 29th, 2023. ? ? Report to [facility] Main Entrance "A" at 10:30 A.M., then check in with the Admitting office. ? Call this number if you have problems the morning of surgery: ? [phone] ? ? If you have any questions prior to your surgery date call [phone]: Open Monday-Friday 8am-4pm ? ? ? Remember: ? Do not eat after midnight the night before your surgery ? ?You may drink clear liquids until 9:30am the morning of your surgery.   ?Clear liquids allowed are: Water, Non-Citrus Juices (without pulp), Carbonated Beverages, Clear Tea, Black Coffee ONLY (NO MILK, CREAM OR POWDERED CREAMER of any kind), and Gatorade ? ?Patient Instructions ? ?The day of surgery (if you have diabetes): ?Drink ONE (1) 12 oz G2 given to you in your pre admission testing appointment by 09:30 the morning of surgery. Drink in one sitting. Do not sip.  ?This drink was given to you during your hospital  ?pre-op appointment visit.  ?Nothing else to drink after completing the  ?12 oz bottle of G2. ? ?       If you have questions, please contact your surgeon?s office.  ?  ? Take these medicines the morning of surgery with A SIP OF WATER:  ? ?amLODipine (NORVASC)  ?amoxicillin-clarithromycin-lansoprazole [facility])  ?benzonatate (TESSALON) ?citalopram (CELEXA) ?omeprazole (PRILOSEC) ?pravastatin (PRAVACHOL) ? ?IF NEEDED: ? ?albuterol (PROVENTIL) nebulizer and inhaler- bring inhaler with you the day of surgery ?cycloSPORINE (RESTASIS)  ? ?As of today, STOP taking any Aspirin (unless otherwise instructed by your surgeon) Aleve, Naproxen, Ibuprofen, Motrin, Advil, Goody's, BC's, all herbal medications, fish oil, and all vitamins. ? ? ?WHAT DO I DO ABOUT MY DIABETES MEDICATION? ? ? ?THE DAY BEFORE SURGERY, do not take [place].  ? ?THE NIGHT BEFORE SURGERY, do not take glipiZIDE (GLUCOTROL XL). ? ?THE MORNING OF SURGERY, do not take FARXIGA, glipiZIDE (GLUCOTROL XL) or  metFORMIN (GLUCOPHAGE). ? ? ?HOW TO MANAGE YOUR DIABETES ?BEFORE AND AFTER SURGERY ? ?Why is it important to control my blood sugar before and after surgery? ?Improving blood sugar levels before and after surgery helps healing and can limit problems. ?A way of improving blood sugar control is eating a healthy diet by: ? Eating less sugar and carbohydrates ? Increasing activity/exercise ? Talking with your doctor about reaching your blood sugar goals ?High blood sugars (greater than 180 mg/dL) can raise your risk of infections and slow your recovery, so you will need to focus on controlling your diabetes during the weeks before surgery. ?Make sure that the doctor who takes care of your diabetes knows about your planned surgery including the date and location. ? ?How do I manage my blood sugar before surgery? ?Check your blood sugar at least 4 times a day, starting 2 days before surgery, to make sure that the level is not too high or low. ? ?Check your blood sugar the morning of your surgery when you wake up and every 2 hours until you get to the Short Stay unit. ? ?If your blood sugar is less than 70 mg/dL, you will need to treat for low blood sugar: ?Do not take insulin. ?Treat a low blood sugar (less than 70 mg/dL) with ? cup of clear juice (cranberry or apple), 4 glucose tablets, OR glucose gel. ?Recheck blood sugar in 15 minutes after treatment (to make sure it is greater than 70 mg/dL). If your blood sugar is not greater than 70 mg/dL on recheck, call  [phone] for further instructions. ?Report your blood sugar to the short stay nurse when you get to Short Stay. ? ?If you are admitted to the hospital after surgery: ?Your blood sugar will be checked by the staff and you will probably be given insulin after surgery (instead of oral diabetes medicines) to make sure you have good blood sugar levels. ?The goal for blood sugar control after surgery is 80-180 mg/dL. ? ? ?The day of surgery: ?     ?Do not wear  jewelry or makeup ?Do not wear lotions, powders, perfumes, or deodorant. ?Do not shave 48 hours prior to surgery.   ?Do not bring valuables to the hospital. ?Do not wear nail polish, gel polish, artificial nails, or any other type of covering on natural nails (fingers and toes) ?If you have artificial nails or gel coating that need to be removed by a nail salon, please have this removed prior to surgery. Artificial nails or gel coating may interfere with anesthesia's ability to adequately monitor your vital signs. ? ?[facility] is not responsible for any belongings or valuables. .  ? ?Do NOT Smoke (Tobacco/Vaping)  24 hours prior to your procedure ? ?If you use a CPAP at night, you may bring your mask for your overnight stay. ?  ?Contacts, glasses, hearing aids, dentures or partials may not be worn into surgery, please bring cases for these belongings ?  ?For patients admitted to the hospital, discharge time will be determined by your treatment team. ?  ?Patients discharged the day of surgery will not be allowed to drive home, and someone needs to stay with them for 24 hours. ? ?NO VISITORS WILL BE ALLOWED IN PRE-OP WHERE PATIENTS ARE PREPPED FOR SURGERY.  ONLY 1 SUPPORT PERSON MAY BE PRESENT IN THE WAITING ROOM WHILE YOU ARE IN SURGERY.  IF YOU ARE TO BE ADMITTED, ONCE YOU ARE IN YOUR ROOM YOU WILL BE ALLOWED TWO (2) VISITORS. 1 (ONE) VISITOR MAY STAY OVERNIGHT BUT MUST ARRIVE TO THE ROOM BY 8pm.  Minor children may have two parents present. Special consideration for safety and communication needs will be reviewed on a case by case basis. ? ?Special instructions:   ? ?Oral Hygiene is also important to reduce your risk of infection.  Remember - BRUSH YOUR TEETH THE MORNING OF SURGERY WITH YOUR REGULAR TOOTHPASTE ? ? ?[facility]- Preparing For Surgery ? ?Before surgery, you can play an important role. Because skin is not sterile, your skin needs to be as free of germs as possible. You can reduce the number of germs  on your skin by washing with CHG (chlorahexidine gluconate) Soap before surgery.  CHG is an antiseptic cleaner which kills germs and bonds with the skin to continue killing germs even after washing.   ? ? ?Please do not use if you have an allergy to CHG or antibacterial soaps. If your skin becomes reddened/irritated stop using the CHG.  ?Do not shave (including legs and underarms) for at least 48 hours prior to first CHG shower. It is OK to shave your face. ? ?Please follow these instructions carefully. ?  ? ? Shower the NIGHT BEFORE SURGERY and the MORNING OF SURGERY with CHG Soap.  ? If you chose to wash your hair, wash your hair first as usual with your normal shampoo. After you shampoo, rinse your hair and body thoroughly to remove the shampoo.  Then [employer] and genitals (private parts) with your normal soap and rinse thoroughly to remove soap. ? ?  After that Use CHG Soap as you would any other liquid soap. You can apply CHG directly to the skin and wash gently with a scrungie or a clean washcloth.  ? ?Apply the CHG Soap to your body ONLY FROM THE NECK DOWN.  Do not use on open wounds or open sores. Avoid contact with your eyes, ears, mouth and genitals (private parts). Wash Face and genitals (private parts)  with your normal soap.  ? ?Wash thoroughly, paying special attention to the area where your surgery will be performed. ? ?Thoroughly rinse your body with warm water from the neck down. ? ?DO NOT shower/wash with your normal soap after using and rinsing off the CHG Soap. ? ?Pat yourself dry with a CLEAN TOWEL. ? ?Wear CLEAN PAJAMAS to bed the night before surgery ? ?Place CLEAN SHEETS on your bed the night before your surgery ? ?DO NOT SLEEP WITH PETS. ? ? ?Day of Surgery: ?Take a shower with CHG soap. ?Wear Clean/Comfortable clothing the morning of surgery ?Do not apply any deodorants/lotions.   ?Remember to brush your teeth WITH YOUR REGULAR TOOTHPASTE. ? ? ? ? ?Notify your provider: ?if you are in close  contact with someone who has COVID  ?or if you develop a fever of 100.4 or greater, sneezing, cough, sore throat, shortness of breath or body aches. ? ?  ?Please read over the following fact sheets that you wer

## 2021-08-10 NOTE — Progress Notes (Signed)
Surgical Instructions ? ? ? Your procedure is scheduled on 08/18/21. ? Report to [facility] Main Entrance "A" at 10:30 A.M., then check in with the Admitting office. ? Call this number if you have problems the morning of surgery: ? [phone] ? ? If you have any questions prior to your surgery date call [phone]: Open Monday-Friday 8am-4pm ? ? ? Remember: ? Do not eat after midnight the night before your surgery ? ?You may drink clear liquids until 9:30am the morning of your surgery.   ?Clear liquids allowed are: Water, Non-Citrus Juices (without pulp), Carbonated Beverages, Clear Tea, Black Coffee ONLY (NO MILK, CREAM OR POWDERED CREAMER of any kind), and Gatorade ? ?Patient Instructions ? ?The day of surgery (if you have diabetes): ?Drink ONE (1) 12 oz G2 given to you in your pre admission testing appointment by 09:30 the morning of surgery. Drink in one sitting. Do not sip.  ?This drink was given to you during your hospital  ?pre-op appointment visit.  ?Nothing else to drink after completing the  ?12 oz bottle of G2. ? ?       If you have questions, please contact your surgeon?s office.  ?  ? Take these medicines the morning of surgery with A SIP OF WATER:  ? ?amLODipine (NORVASC)  ?amoxicillin-clarithromycin-lansoprazole [facility])  ?benzonatate (TESSALON) ?citalopram (CELEXA) ?omeprazole (PRILOSEC) ?pravastatin (PRAVACHOL) ? ?IF NEEDED: ? ?albuterol (PROVENTIL) nebulizer and inhaler- bring inhaler with you the day of surgery ?cycloSPORINE (RESTASIS)  ? ?As of today, STOP taking any Aspirin (unless otherwise instructed by your surgeon) Aleve, Naproxen, Ibuprofen, Motrin, Advil, Goody's, BC's, all herbal medications, fish oil, and all vitamins. ? ? ?WHAT DO I DO ABOUT MY DIABETES MEDICATION? ? ? ?THE DAY BEFORE SURGERY, do not take [place].  ? ?THE NIGHT BEFORE SURGERY, do not take glipiZIDE (GLUCOTROL XL). ? ?THE MORNING OF SURGERY, do not take FARXIGA, glipiZIDE (GLUCOTROL XL) or metFORMIN  (GLUCOPHAGE). ? ? ?HOW TO MANAGE YOUR DIABETES ?BEFORE AND AFTER SURGERY ? ?Why is it important to control my blood sugar before and after surgery? ?Improving blood sugar levels before and after surgery helps healing and can limit problems. ?A way of improving blood sugar control is eating a healthy diet by: ? Eating less sugar and carbohydrates ? Increasing activity/exercise ? Talking with your doctor about reaching your blood sugar goals ?High blood sugars (greater than 180 mg/dL) can raise your risk of infections and slow your recovery, so you will need to focus on controlling your diabetes during the weeks before surgery. ?Make sure that the doctor who takes care of your diabetes knows about your planned surgery including the date and location. ? ?How do I manage my blood sugar before surgery? ?Check your blood sugar at least 4 times a day, starting 2 days before surgery, to make sure that the level is not too high or low. ? ?Check your blood sugar the morning of your surgery when you wake up and every 2 hours until you get to the Short Stay unit. ? ?If your blood sugar is less than 70 mg/dL, you will need to treat for low blood sugar: ?Do not take insulin. ?Treat a low blood sugar (less than 70 mg/dL) with ? cup of clear juice (cranberry or apple), 4 glucose tablets, OR glucose gel. ?Recheck blood sugar in 15 minutes after treatment (to make sure it is greater than 70 mg/dL). If your blood sugar is not greater than 70 mg/dL on recheck, call [phone] for further instructions. ?  Report your blood sugar to the short stay nurse when you get to Short Stay. ? ?If you are admitted to the hospital after surgery: ?Your blood sugar will be checked by the staff and you will probably be given insulin after surgery (instead of oral diabetes medicines) to make sure you have good blood sugar levels. ?The goal for blood sugar control after surgery is 80-180 mg/dL. ? ? ?The day of surgery: ?     ?Do not wear jewelry or  makeup ?Do not wear lotions, powders, perfumes, or deodorant. ?Do not shave 48 hours prior to surgery.   ?Do not bring valuables to the hospital. ?Do not wear nail polish, gel polish, artificial nails, or any other type of covering on natural nails (fingers and toes) ?If you have artificial nails or gel coating that need to be removed by a nail salon, please have this removed prior to surgery. Artificial nails or gel coating may interfere with anesthesia's ability to adequately monitor your vital signs. ? ? is not responsible for any belongings or valuables. .  ? ?Do NOT Smoke (Tobacco/Vaping)  24 hours prior to your procedure ? ?If you use a CPAP at night, you may bring your mask for your overnight stay. ?  ?Contacts, glasses, hearing aids, dentures or partials may not be worn into surgery, please bring cases for these belongings ?  ?For patients admitted to the hospital, discharge time will be determined by your treatment team. ?  ?Patients discharged the day of surgery will not be allowed to drive home, and someone needs to stay with them for 24 hours. ? ?NO VISITORS WILL BE ALLOWED IN PRE-OP WHERE PATIENTS ARE PREPPED FOR SURGERY.  ONLY 1 SUPPORT PERSON MAY BE PRESENT IN THE WAITING ROOM WHILE YOU ARE IN SURGERY.  IF YOU ARE TO BE ADMITTED, ONCE YOU ARE IN YOUR ROOM YOU WILL BE ALLOWED TWO (2) VISITORS. 1 (ONE) VISITOR MAY STAY OVERNIGHT BUT MUST ARRIVE TO THE ROOM BY 8pm.  Minor children may have two parents present. Special consideration for safety and communication needs will be reviewed on a case by case basis. ? ?Special instructions:   ? ?Oral Hygiene is also important to reduce your risk of infection.  Remember - BRUSH YOUR TEETH THE MORNING OF SURGERY WITH YOUR REGULAR TOOTHPASTE ? ? ?- Preparing For Surgery ? ?Before surgery, you can play an important role. Because skin is not sterile, your skin needs to be as free of germs as possible. You can reduce the number of germs on your  skin by washing with CHG (chlorahexidine gluconate) Soap before surgery.  CHG is an antiseptic cleaner which kills germs and bonds with the skin to continue killing germs even after washing.   ? ? ?Please do not use if you have an allergy to CHG or antibacterial soaps. If your skin becomes reddened/irritated stop using the CHG.  ?Do not shave (including legs and underarms) for at least 48 hours prior to first CHG shower. It is OK to shave your face. ? ?Please follow these instructions carefully. ?  ? ? Shower the NIGHT BEFORE SURGERY and the MORNING OF SURGERY with CHG Soap.  ? If you chose to wash your hair, wash your hair first as usual with your normal shampoo. After you shampoo, rinse your hair and body thoroughly to remove the shampoo.  Then [employer] and genitals (private parts) with your normal soap and rinse thoroughly to remove soap. ? ?After that Use CHG  Soap as you would any other liquid soap. You can apply CHG directly to the skin and wash gently with a scrungie or a clean washcloth.  ? ?Apply the CHG Soap to your body ONLY FROM THE NECK DOWN.  Do not use on open wounds or open sores. Avoid contact with your eyes, ears, mouth and genitals (private parts). Wash Face and genitals (private parts)  with your normal soap.  ? ?Wash thoroughly, paying special attention to the area where your surgery will be performed. ? ?Thoroughly rinse your body with warm water from the neck down. ? ?DO NOT shower/wash with your normal soap after using and rinsing off the CHG Soap. ? ?Pat yourself dry with a CLEAN TOWEL. ? ?Wear CLEAN PAJAMAS to bed the night before surgery ? ?Place CLEAN SHEETS on your bed the night before your surgery ? ?DO NOT SLEEP WITH PETS. ? ? ?Day of Surgery: ?Take a shower with CHG soap. ?Wear Clean/Comfortable clothing the morning of surgery ?Do not apply any deodorants/lotions.   ?Remember to brush your teeth WITH YOUR REGULAR TOOTHPASTE. ? ? ? ? ?Notify your provider: ?if you are in close contact  with someone who has COVID  ?or if you develop a fever of 100.4 or greater, sneezing, cough, sore throat, shortness of breath or body aches. ? ?  ?Please read over the following fact sheets that you were given.  ? ?

## 2021-08-10 NOTE — Progress Notes (Addendum)
PCP - Stoney Bang, MD ?Cardiologist - denies ? ?PPM/ICD - denies ?Device Orders - n/a ?Rep Notified - n/a ? ?Chest x-[place] - 08/10/2021 ?EKG - 08/10/2021 ?Stress Test - denies ?ECHO - denies ?Cardiac Cath - denies ? ?Sleep Study - denies ?CPAP - n/a ? ?Fasting Blood Sugar - 93 - 132 ?Checks Blood Sugar 2 times a day ?CBG today - 161 ?A1C - done in PAT on 08/10/2021 ? ?Blood Thinner Instructions: n/a ? ?Aspirin Instructions: Patient was instructed: As of today, STOP taking any Aspirin (unless otherwise instructed by your surgeon) Aleve, Naproxen, Ibuprofen, Motrin, Advil, Goody's, BC's, all herbal medications, fish oil, and all vitamins ? ?ERAS Protcol - yes ? ?PRE-SURGERY G2- yes, until 09:30 o'clock ? ?COVID TEST- no, per new policy ? ? ?Anesthesia review: yes - abnormal EKG in PAT ? ?Patient denies shortness of breath, fever, cough and chest pain at PAT appointment ? ? ?All instructions explained to the patient, with a verbal understanding of the material. Patient agrees to go over the instructions while at home for a better understanding. Patient also instructed to self quarantine after being tested for COVID-19. The opportunity to ask questions was provided. ?  ?

## 2021-08-12 ENCOUNTER — Ambulatory Visit (INDEPENDENT_AMBULATORY_CARE_PROVIDER_SITE_OTHER): Payer: Medicare Other | Admitting: Surgery

## 2021-08-12 ENCOUNTER — Other Ambulatory Visit: Payer: Self-pay

## 2021-08-12 ENCOUNTER — Encounter: Payer: Self-pay | Admitting: Surgery

## 2021-08-12 VITALS — BP 137/71 | HR 83 | Ht 64.0 in | Wt 171.7 lb

## 2021-08-12 DIAGNOSIS — M1612 Unilateral primary osteoarthritis, left hip: Secondary | ICD-10-CM

## 2021-08-12 NOTE — Progress Notes (Signed)
66 year old black female history of end-stage DJD left hip and pain comes in for preop evaluation.  States the left hip symptoms unchanged from previous visit and she is wanting to proceed with left total hip arthroplasty as scheduled.  Today history and physical performed.  Review of systems negative. ? ?Surgical procedure discussed along with potential hospital stay.  All questions answered. ?

## 2021-08-13 NOTE — H&P (Signed)
TOTAL HIP ADMISSION H&P ? ?Patient is admitted for left total hip arthroplasty. ? ?Subjective: ? ?Chief Complaint: left hip pain ? ?HPI: Taylor Cohen, 66 y.o. female, has a history of pain and functional disability in the left hip(s) due to arthritis and patient has failed non-surgical conservative treatments for greater than 12 weeks to include NSAID's and/or analgesics, supervised PT with diminished ADL's post treatment, use of assistive devices, weight reduction as appropriate, and activity modification.  Onset of symptoms was gradual starting 10 years ago with gradually worsening course since that time.The patient noted no past surgery on the left hip(s).  Patient currently rates pain in the left hip at 10 out of 10 with activity. Patient has night pain, worsening of pain with activity and weight bearing, trendelenberg gait, pain that interfers with activities of daily living, and pain with passive range of motion. Patient has evidence of subchondral sclerosis, periarticular osteophytes, and joint space narrowing by imaging studies. This condition presents safety issues increasing the risk of falls..  There is no current active infection. ? ?Patient Active Problem List  ? Diagnosis Date Noted  ? Unilateral primary osteoarthritis, left hip 07/01/2021  ? Dysphagia 02/15/2021  ? ?Past Medical History:  ?Diagnosis Date  ? Asthma   ? Breast cancer ([place])   ? Around 2004-2005. Left; s/p mastectomy and chemo  ? Diabetes ([place])   ? Dyspnea   ? GERD (gastroesophageal reflux disease)   ? History of kidney stones   ? HLD (hyperlipidemia)   ? HTN (hypertension)   ?  ?Past Surgical History:  ?Procedure Laterality Date  ? BALLOON DILATION N/A 03/02/2021  ? Procedure: BALLOON DILATION;  Surgeon: Eloise Harman, DO;  Location[facility];  Service: Endoscopy;  Laterality: N/A;  ? BIOPSY  03/02/2021  ? Procedure: BIOPSY;  Surgeon: Eloise Harman, DO;  Location: [facility];  Service: Endoscopy;;  ? BREAST SURGERY     ? left mastectomy  ? COLONOSCOPY    ? [place]  ? ESOPHAGOGASTRODUODENOSCOPY (EGD) WITH PROPOFOL N/A 03/02/2021  ? Procedure: ESOPHAGOGASTRODUODENOSCOPY (EGD) WITH PROPOFOL;  Surgeon: Eloise Harman, DO;  Location: [facility];  Service: Endoscopy;  Laterality: N/A;  10:00am  ? MASTECTOMY Left   ? 2004 or 2005  ? TUBAL LIGATION    ?  ?No current facility-administered medications for this encounter.  ? ?Current Outpatient Medications  ?Medication Sig Dispense Refill Last Dose  ? acetaminophen (TYLENOL) 500 MG tablet Take 1,000 mg by mouth every 6 (six) hours as needed for mild pain.     ? albuterol (PROVENTIL) (2.5 MG/3ML) 0.083% nebulizer solution Take 2.5 mg by nebulization every 6 (six) hours as needed for wheezing or shortness of breath.     ? albuterol (VENTOLIN HFA) 108 (90 Base) MCG/ACT inhaler Inhale 1-2 puffs into the lungs every 6 (six) hours as needed for wheezing or shortness of breath.     ? amLODipine (NORVASC) 10 MG tablet Take 10 mg by mouth in the morning.     ? Calcium Carb-Cholecalciferol (CALCIUM 600+D3 PO) Take 1 tablet by mouth in the morning.     ? citalopram (CELEXA) 20 MG tablet Take 20 mg by mouth in the morning.     ? cycloSPORINE (RESTASIS) 0.05 % ophthalmic emulsion Place 1 drop into both eyes 2 (two) times daily as needed (dry/irritated eyes).     ? FARXIGA 10 MG TABS tablet Take 10 mg by mouth every morning.     ? glipiZIDE (  GLUCOTROL XL) 5 MG 24 hr tablet Take 5 mg by mouth daily.     ? hydrochlorothiazide (HYDRODIURIL) 25 MG tablet Take 25 mg by mouth in the morning.     ? ibuprofen (ADVIL) 200 MG tablet Take 400 mg by mouth every 6 (six) hours as needed for mild pain.     ? metFORMIN (GLUCOPHAGE) 1000 MG tablet Take 1,000 mg by mouth every morning.     ? Multiple Vitamin (MULTIVITAMIN) tablet Take 1 tablet by mouth daily.     ? omega-3 acid ethyl esters (LOVAZA) 1 g capsule Take 1 capsule by mouth 2 (two) times daily.     ? pravastatin (PRAVACHOL) 40 MG tablet Take 40 mg by  mouth daily.     ? benzonatate (TESSALON) 100 MG capsule Take 1 capsule (100 mg total) by mouth every 8 (eight) hours. (Patient not taking: Reported on 08/10/2021) 21 capsule 0 Not Taking  ? ?Allergies  ?Allergen Reactions  ? Lisinopril Swelling  ?  ?Social History  ? ?Tobacco Use  ? Smoking status: Never  ? Smokeless tobacco: Never  ?Substance Use Topics  ? Alcohol use: Never  ?  ?Family History  ?Problem Relation Age of Onset  ? Colon cancer Neg Hx   ? Stomach cancer Neg Hx   ? Esophageal cancer Neg Hx   ?  ? ?Review of Systems  ?Constitutional:  Positive for activity change.  ?HENT: Negative.    ?Respiratory: Negative.    ?Cardiovascular: Negative.   ?Gastrointestinal: Negative.   ?Genitourinary: Negative.   ?Musculoskeletal:  Positive for gait problem.  ?Skin: Negative.   ?Psychiatric/Behavioral: Negative.    ? ?Objective: ? ?Physical Exam ?HENT:  ?   Head: Normocephalic.  ?   Nose: Nose normal.  ?Cardiovascular:  ?   Heart sounds: Normal heart sounds.  ?Pulmonary:  ?   Effort: Pulmonary effort is normal. No respiratory distress.  ?   Breath sounds: Normal breath sounds.  ?Abdominal:  ?   General: Bowel sounds are normal.  ?   Tenderness: There is no abdominal tenderness.  ?Musculoskeletal:  ?   Cervical back: Normal range of motion.  ?Neurological:  ?   Mental Status: She is alert and oriented to person, place, and time.  ?Psychiatric:     ?   Mood and Affect: Mood normal.  ? ? ?Vital signs in last 24 hours: ?  ? ?Labs: ? ? ?Estimated body mass index is 29.47 kg/m? as calculated from the following: ?  Height as of 08/12/21: '5\' 4"'$  (1.626 m). ?  Weight as of 08/12/21: 77.9 kg. ? ? ?Imaging Review ?Plain radiographs demonstrate moderate degenerative joint disease of the left hip(s). The bone quality appears to be good for age and reported activity level. ? ? ? ? ? ?Assessment/Plan: ? ?End stage arthritis, left hip(s) ? ?The patient history, physical examination, clinical judgement of the provider and imaging studies  are consistent with end stage degenerative joint disease of the left hip(s) and total hip arthroplasty is deemed medically necessary. The treatment options including medical management, injection therapy, arthroscopy and arthroplasty were discussed at length. The risks and benefits of total hip arthroplasty were presented and reviewed. The risks due to aseptic loosening, infection, stiffness, dislocation/subluxation,  thromboembolic complications and other imponderables were discussed.  The patient acknowledged the explanation, agreed to proceed with the plan and consent was signed. Patient is being admitted for inpatient treatment for surgery, pain control, PT, OT, prophylactic antibiotics, VTE prophylaxis, progressive ambulation and  ADL's and discharge planning.The patient is planning to be discharged home with home health services ? ? ?

## 2021-08-17 ENCOUNTER — Telehealth: Payer: Self-pay | Admitting: *Deleted

## 2021-08-17 NOTE — Telephone Encounter (Signed)
Ortho bundle Pre-op call completed. 

## 2021-08-17 NOTE — Care Plan (Signed)
[facility] RNCM call to patient prior to her upcoming Left total hip arthroplasty with Dr. Lorin Mercy on 08/18/21. She is an Ortho bundle patient through [facility] and is agreeable to case management. She lives with her son, who will be able to assist her after surgery at home. She will need a RW, which will be ordered for her at the hospital and delivered to her room prior to discharge. Will determine if HHPT will be needed after discharge. Will see how she does with therapy in hospital after surgery. Reviewed all post op care instructions with patient and her son. Will continue to follow for needs. ?

## 2021-08-18 ENCOUNTER — Ambulatory Visit (HOSPITAL_BASED_OUTPATIENT_CLINIC_OR_DEPARTMENT_OTHER): Payer: Medicare Other | Admitting: Anesthesiology

## 2021-08-18 ENCOUNTER — Other Ambulatory Visit: Payer: Self-pay

## 2021-08-18 ENCOUNTER — Encounter (HOSPITAL_COMMUNITY): Payer: Self-pay | Admitting: Orthopaedic Surgery

## 2021-08-18 ENCOUNTER — Ambulatory Visit (HOSPITAL_COMMUNITY): Payer: Medicare Other | Admitting: Physician Assistant

## 2021-08-18 ENCOUNTER — Observation Stay (HOSPITAL_COMMUNITY)
Admission: RE | Admit: 2021-08-18 | Discharge: 2021-08-20 | Disposition: A | Discharge status: Home / Self Care | Payer: Medicare Other | Attending: Orthopaedic Surgery | Admitting: Orthopaedic Surgery

## 2021-08-18 ENCOUNTER — Ambulatory Visit (HOSPITAL_COMMUNITY): Payer: Medicare Other

## 2021-08-18 ENCOUNTER — Encounter (HOSPITAL_COMMUNITY)
Admission: RE | Disposition: A | Discharge status: Home / Self Care | Payer: Self-pay | Source: Home / Self Care | Attending: Orthopaedic Surgery

## 2021-08-18 ENCOUNTER — Observation Stay (HOSPITAL_COMMUNITY): Payer: Medicare Other

## 2021-08-18 DIAGNOSIS — Z01818 Encounter for other preprocedural examination: Secondary | ICD-10-CM

## 2021-08-18 DIAGNOSIS — Z79899 Other long term (current) drug therapy: Secondary | ICD-10-CM | POA: Insufficient documentation

## 2021-08-18 DIAGNOSIS — E119 Type 2 diabetes mellitus without complications: Secondary | ICD-10-CM | POA: Diagnosis not present

## 2021-08-18 DIAGNOSIS — Z853 Personal history of malignant neoplasm of breast: Secondary | ICD-10-CM | POA: Diagnosis not present

## 2021-08-18 DIAGNOSIS — M1612 Unilateral primary osteoarthritis, left hip: Secondary | ICD-10-CM | POA: Diagnosis not present

## 2021-08-18 DIAGNOSIS — J45909 Unspecified asthma, uncomplicated: Secondary | ICD-10-CM | POA: Diagnosis not present

## 2021-08-18 DIAGNOSIS — Z7984 Long term (current) use of oral hypoglycemic drugs: Secondary | ICD-10-CM

## 2021-08-18 DIAGNOSIS — Z96642 Presence of left artificial hip joint: Secondary | ICD-10-CM | POA: Diagnosis not present

## 2021-08-18 DIAGNOSIS — Z9889 Other specified postprocedural states: Secondary | ICD-10-CM | POA: Diagnosis not present

## 2021-08-18 DIAGNOSIS — I10 Essential (primary) hypertension: Secondary | ICD-10-CM

## 2021-08-18 DIAGNOSIS — Z471 Aftercare following joint replacement surgery: Secondary | ICD-10-CM | POA: Diagnosis not present

## 2021-08-18 HISTORY — PX: TOTAL HIP ARTHROPLASTY: SHX124

## 2021-08-18 LAB — GLUCOSE, CAPILLARY
Glucose-Capillary: 142 mg/dL — ABNORMAL HIGH (ref 70–99)
Glucose-Capillary: 110 mg/dL — ABNORMAL HIGH (ref 70–99)
Glucose-Capillary: 111 mg/dL — ABNORMAL HIGH (ref 70–99)
Glucose-Capillary: 136 mg/dL — ABNORMAL HIGH (ref 70–99)

## 2021-08-18 LAB — ABO/RH: ABO/RH(D): A POS

## 2021-08-18 SURGERY — ARTHROPLASTY, HIP, TOTAL, ANTERIOR APPROACH
Anesthesia: Spinal | Site: Hip | Laterality: Left

## 2021-08-18 MED ORDER — ONDANSETRON HCL 4 MG/2ML IJ SOLN
4.0000 mg | Freq: Once | INTRAMUSCULAR | Status: AC
  Administered 2021-08-18: 4 mg via INTRAVENOUS

## 2021-08-18 MED ORDER — CYCLOSPORINE 0.05 % OP EMUL
1.0000 [drp] | Freq: Two times a day (BID) | OPHTHALMIC | Status: DC | PRN
  Filled 2021-08-18: qty 30

## 2021-08-18 MED ORDER — TRANEXAMIC ACID-NACL 1000-0.7 MG/100ML-% IV SOLN
INTRAVENOUS | Status: DC | PRN
  Administered 2021-08-18: 1000 mg via INTRAVENOUS

## 2021-08-18 MED ORDER — LACTATED RINGERS IV SOLN: INTRAVENOUS | Status: DC

## 2021-08-18 MED ORDER — MIDAZOLAM HCL 2 MG/2ML IJ SOLN: 0.5000 mg | Freq: Once | INTRAMUSCULAR | Status: DC | PRN

## 2021-08-18 MED ORDER — OXYCODONE HCL 5 MG/5ML PO SOLN: 5.0000 mg | Freq: Once | ORAL | Status: DC | PRN

## 2021-08-18 MED ORDER — PROPOFOL 500 MG/50ML IV EMUL
INTRAVENOUS | Status: DC | PRN
  Administered 2021-08-18: 50 ug/kg/min via INTRAVENOUS

## 2021-08-18 MED ORDER — ONDANSETRON HCL 4 MG/2ML IJ SOLN: 4.0000 mg | Freq: Four times a day (QID) | INTRAMUSCULAR | Status: DC | PRN

## 2021-08-18 MED ORDER — ASPIRIN EC 325 MG PO TBEC
325.0000 mg | DELAYED_RELEASE_TABLET | Freq: Every day | ORAL | Status: DC
  Administered 2021-08-19 – 2021-08-20 (×2): 325 mg via ORAL
  Filled 2021-08-18 (×2): qty 1

## 2021-08-18 MED ORDER — BUPIVACAINE LIPOSOME 1.3 % IJ SUSP
INTRAMUSCULAR | Status: DC | PRN
  Administered 2021-08-18: 10 mL

## 2021-08-18 MED ORDER — OXYCODONE HCL 5 MG PO TABS
5.0000 mg | ORAL_TABLET | ORAL | Status: DC | PRN
  Administered 2021-08-18 – 2021-08-19 (×2): 5 mg via ORAL
  Filled 2021-08-18 (×3): qty 1

## 2021-08-18 MED ORDER — ONDANSETRON HCL 4 MG PO TABS: 4.0000 mg | ORAL_TABLET | Freq: Four times a day (QID) | ORAL | Status: DC | PRN

## 2021-08-18 MED ORDER — ORAL CARE MOUTH RINSE: 15.0000 mL | Freq: Once | OROMUCOSAL | Status: AC

## 2021-08-18 MED ORDER — ONDANSETRON HCL 4 MG/2ML IJ SOLN
INTRAMUSCULAR | Status: DC | PRN
  Administered 2021-08-18: 4 mg via INTRAVENOUS

## 2021-08-18 MED ORDER — AMLODIPINE BESYLATE 10 MG PO TABS
10.0000 mg | ORAL_TABLET | Freq: Every morning | ORAL | Status: DC
  Administered 2021-08-19 – 2021-08-20 (×2): 10 mg via ORAL
  Filled 2021-08-18 (×2): qty 1

## 2021-08-18 MED ORDER — CHLORHEXIDINE GLUCONATE 0.12 % MT SOLN
15.0000 mL | Freq: Once | OROMUCOSAL | Status: AC
  Administered 2021-08-18: 15 mL via OROMUCOSAL
  Filled 2021-08-18: qty 15

## 2021-08-18 MED ORDER — ONDANSETRON HCL 4 MG/2ML IJ SOLN
INTRAMUSCULAR | Status: AC
  Filled 2021-08-18: qty 2

## 2021-08-18 MED ORDER — ACETAMINOPHEN 500 MG PO TABS
1000.0000 mg | ORAL_TABLET | Freq: Once | ORAL | Status: AC
  Administered 2021-08-18: 1000 mg via ORAL
  Filled 2021-08-18: qty 2

## 2021-08-18 MED ORDER — BUPIVACAINE HCL (PF) 0.5 % IJ SOLN
INTRAMUSCULAR | Status: AC
  Filled 2021-08-18: qty 30

## 2021-08-18 MED ORDER — BUPIVACAINE IN DEXTROSE 0.75-8.25 % IT SOLN
INTRATHECAL | Status: DC | PRN
  Administered 2021-08-18: 12 mg via INTRATHECAL

## 2021-08-18 MED ORDER — METHOCARBAMOL 500 MG PO TABS
ORAL_TABLET | ORAL | Status: AC
  Filled 2021-08-18: qty 1

## 2021-08-18 MED ORDER — METOCLOPRAMIDE HCL 5 MG PO TABS: 5.0000 mg | ORAL_TABLET | Freq: Three times a day (TID) | ORAL | Status: DC | PRN

## 2021-08-18 MED ORDER — ALBUTEROL SULFATE HFA 108 (90 BASE) MCG/ACT IN AERS: 1.0000 | INHALATION_SPRAY | Freq: Four times a day (QID) | RESPIRATORY_TRACT | Status: DC | PRN

## 2021-08-18 MED ORDER — 0.9 % SODIUM CHLORIDE (POUR BTL) OPTIME
TOPICAL | Status: DC | PRN
  Administered 2021-08-18: 1000 mL

## 2021-08-18 MED ORDER — METFORMIN HCL 500 MG PO TABS
1000.0000 mg | ORAL_TABLET | Freq: Every day | ORAL | Status: DC
Start: 2021-08-19 — End: 2021-08-20
  Administered 2021-08-19 – 2021-08-20 (×2): 1000 mg via ORAL
  Filled 2021-08-18 (×2): qty 2

## 2021-08-18 MED ORDER — HYDROMORPHONE HCL 1 MG/ML IJ SOLN
0.5000 mg | INTRAMUSCULAR | Status: DC | PRN
  Administered 2021-08-18 – 2021-08-20 (×3): 0.5 mg via INTRAVENOUS
  Filled 2021-08-18 (×3): qty 0.5

## 2021-08-18 MED ORDER — INSULIN ASPART 100 UNIT/ML IJ SOLN
0.0000 [IU] | INTRAMUSCULAR | Status: DC | PRN
  Administered 2021-08-18: 2 [IU] via SUBCUTANEOUS

## 2021-08-18 MED ORDER — PROPOFOL 10 MG/ML IV BOLUS
INTRAVENOUS | Status: DC | PRN
  Administered 2021-08-18: 100 mg via INTRAVENOUS

## 2021-08-18 MED ORDER — BUPIVACAINE HCL 0.5 % IJ SOLN
INTRAMUSCULAR | Status: DC | PRN
  Administered 2021-08-18: 10 mL

## 2021-08-18 MED ORDER — METOCLOPRAMIDE HCL 5 MG/ML IJ SOLN: 5.0000 mg | Freq: Three times a day (TID) | INTRAMUSCULAR | Status: DC | PRN

## 2021-08-18 MED ORDER — CITALOPRAM HYDROBROMIDE 20 MG PO TABS
20.0000 mg | ORAL_TABLET | Freq: Every morning | ORAL | Status: DC
Start: 2021-08-19 — End: 2021-08-20
  Administered 2021-08-19 – 2021-08-20 (×2): 20 mg via ORAL
  Filled 2021-08-18 (×2): qty 1

## 2021-08-18 MED ORDER — HYDROMORPHONE HCL 1 MG/ML IJ SOLN
INTRAMUSCULAR | Status: AC
  Filled 2021-08-18: qty 1

## 2021-08-18 MED ORDER — CEFAZOLIN SODIUM-DEXTROSE 2-4 GM/100ML-% IV SOLN
2.0000 g | INTRAVENOUS | Status: AC
  Administered 2021-08-18: 2 g via INTRAVENOUS

## 2021-08-18 MED ORDER — BUPIVACAINE LIPOSOME 1.3 % IJ SUSP
10.0000 mL | Freq: Once | INTRAMUSCULAR | Status: DC
  Filled 2021-08-18: qty 10

## 2021-08-18 MED ORDER — DAPAGLIFLOZIN PROPANEDIOL 10 MG PO TABS
10.0000 mg | ORAL_TABLET | Freq: Every morning | ORAL | Status: DC
  Administered 2021-08-19 – 2021-08-20 (×2): 10 mg via ORAL
  Filled 2021-08-18 (×2): qty 1

## 2021-08-18 MED ORDER — HYDROCHLOROTHIAZIDE 25 MG PO TABS
25.0000 mg | ORAL_TABLET | Freq: Every morning | ORAL | Status: DC
  Administered 2021-08-19 – 2021-08-20 (×2): 25 mg via ORAL
  Filled 2021-08-18 (×2): qty 1

## 2021-08-18 MED ORDER — MEPERIDINE HCL 25 MG/ML IJ SOLN: 6.2500 mg | INTRAMUSCULAR | Status: DC | PRN

## 2021-08-18 MED ORDER — ACETAMINOPHEN 325 MG PO TABS
325.0000 mg | ORAL_TABLET | Freq: Four times a day (QID) | ORAL | Status: DC | PRN
  Administered 2021-08-19: 650 mg via ORAL
  Filled 2021-08-18: qty 2

## 2021-08-18 MED ORDER — GLIPIZIDE ER 5 MG PO TB24
5.0000 mg | ORAL_TABLET | Freq: Every day | ORAL | Status: DC
Start: 2021-08-19 — End: 2021-08-20
  Administered 2021-08-19 – 2021-08-20 (×2): 5 mg via ORAL
  Filled 2021-08-18 (×2): qty 1

## 2021-08-18 MED ORDER — BUPIVACAINE LIPOSOME 1.3 % IJ SUSP
INTRAMUSCULAR | Status: AC
  Filled 2021-08-18: qty 20

## 2021-08-18 MED ORDER — OXYCODONE HCL 5 MG PO TABS
ORAL_TABLET | ORAL | Status: AC
  Administered 2021-08-18: 5 mg via ORAL
  Filled 2021-08-18: qty 1

## 2021-08-18 MED ORDER — ALBUTEROL SULFATE (2.5 MG/3ML) 0.083% IN NEBU: 2.5000 mg | INHALATION_SOLUTION | Freq: Four times a day (QID) | RESPIRATORY_TRACT | Status: DC | PRN

## 2021-08-18 MED ORDER — PRAVASTATIN SODIUM 40 MG PO TABS
40.0000 mg | ORAL_TABLET | Freq: Every day | ORAL | Status: DC
  Administered 2021-08-19 – 2021-08-20 (×2): 40 mg via ORAL
  Filled 2021-08-18 (×2): qty 1

## 2021-08-18 MED ORDER — CEFAZOLIN SODIUM-DEXTROSE 2-4 GM/100ML-% IV SOLN
INTRAVENOUS | Status: AC
  Filled 2021-08-18: qty 100

## 2021-08-18 MED ORDER — PHENYLEPHRINE HCL-NACL 20-0.9 MG/250ML-% IV SOLN
INTRAVENOUS | Status: DC | PRN
  Administered 2021-08-18: 50 ug/min via INTRAVENOUS

## 2021-08-18 MED ORDER — MENTHOL 3 MG MT LOZG: 1.0000 | LOZENGE | OROMUCOSAL | Status: DC | PRN

## 2021-08-18 MED ORDER — BENZONATATE 100 MG PO CAPS
100.0000 mg | ORAL_CAPSULE | Freq: Three times a day (TID) | ORAL | Status: DC
Start: 2021-08-18 — End: 2021-08-20
  Administered 2021-08-18 – 2021-08-20 (×6): 100 mg via ORAL
  Filled 2021-08-18 (×6): qty 1

## 2021-08-18 MED ORDER — METHOCARBAMOL 500 MG PO TABS
500.0000 mg | ORAL_TABLET | Freq: Four times a day (QID) | ORAL | Status: DC | PRN
  Administered 2021-08-18 – 2021-08-19 (×2): 500 mg via ORAL
  Filled 2021-08-18: qty 1

## 2021-08-18 MED ORDER — OXYCODONE HCL 5 MG PO TABS: 5.0000 mg | ORAL_TABLET | Freq: Once | ORAL | Status: DC | PRN

## 2021-08-18 MED ORDER — SODIUM CHLORIDE 0.9 % IV SOLN: INTRAVENOUS | Status: DC

## 2021-08-18 MED ORDER — INSULIN ASPART 100 UNIT/ML IJ SOLN
0.0000 [IU] | Freq: Three times a day (TID) | INTRAMUSCULAR | Status: DC
  Administered 2021-08-19: 2 [IU] via SUBCUTANEOUS
  Administered 2021-08-19 – 2021-08-20 (×3): 3 [IU] via SUBCUTANEOUS

## 2021-08-18 MED ORDER — HYDROMORPHONE HCL 1 MG/ML IJ SOLN
0.2500 mg | INTRAMUSCULAR | Status: DC | PRN
  Administered 2021-08-18: 0.5 mg via INTRAVENOUS
  Administered 2021-08-18 (×2): 0.25 mg via INTRAVENOUS

## 2021-08-18 MED ORDER — POLYETHYLENE GLYCOL 3350 17 G PO PACK: 17.0000 g | PACK | Freq: Every day | ORAL | Status: DC | PRN

## 2021-08-18 MED ORDER — DOCUSATE SODIUM 100 MG PO CAPS
100.0000 mg | ORAL_CAPSULE | Freq: Two times a day (BID) | ORAL | Status: DC
  Administered 2021-08-18 – 2021-08-20 (×4): 100 mg via ORAL
  Filled 2021-08-18 (×4): qty 1

## 2021-08-18 MED ORDER — PHENOL 1.4 % MT LIQD: 1.0000 | OROMUCOSAL | Status: DC | PRN

## 2021-08-18 MED ORDER — METHOCARBAMOL 1000 MG/10ML IJ SOLN
500.0000 mg | Freq: Four times a day (QID) | INTRAVENOUS | Status: DC | PRN
  Filled 2021-08-18 (×2): qty 5

## 2021-08-18 SURGICAL SUPPLY — 62 items
ADH SKN CLS APL DERMABOND .7 (GAUZE/BANDAGES/DRESSINGS)
APL SKNCLS STERI-STRIP NONHPOA (GAUZE/BANDAGES/DRESSINGS)
BAG COUNTER SPONGE SURGICOUNT (BAG) ×2 IMPLANT
BAG SPNG CNTER NS LX DISP (BAG) ×1
BENZOIN TINCTURE PRP APPL 2/3 (GAUZE/BANDAGES/DRESSINGS) ×1 IMPLANT
BLADE CLIPPER SURG (BLADE) IMPLANT
BLADE SAW SGTL 18X1.27X75 (BLADE) ×2 IMPLANT
COVER PERINEAL POST (MISCELLANEOUS) ×2 IMPLANT
COVER SURGICAL LIGHT HANDLE (MISCELLANEOUS) ×2 IMPLANT
CUP SECTOR GRIPTON 50MM (Cup) ×1 IMPLANT
DERMABOND ADVANCED (GAUZE/BANDAGES/DRESSINGS)
DERMABOND ADVANCED .7 DNX12 (GAUZE/BANDAGES/DRESSINGS) IMPLANT
DRAPE C-ARM 42X72 X-RAY (DRAPES) ×2 IMPLANT
DRAPE IMP U-DRAPE 54X76 (DRAPES) ×2 IMPLANT
DRAPE STERI IOBAN 125X83 (DRAPES) ×2 IMPLANT
DRAPE U-SHAPE 47X51 STRL (DRAPES) ×6 IMPLANT
DRSG MEPILEX BORDER 4X12 (GAUZE/BANDAGES/DRESSINGS) ×1 IMPLANT
DURAPREP 26ML APPLICATOR (WOUND CARE) ×2 IMPLANT
ELECT BLADE 4.0 EZ CLEAN MEGAD (MISCELLANEOUS)
ELECT CAUTERY BLADE 6.4 (BLADE) ×2 IMPLANT
ELECT REM PT RETURN 9FT ADLT (ELECTROSURGICAL) ×2
ELECTRODE BLDE 4.0 EZ CLN MEGD (MISCELLANEOUS) IMPLANT
ELECTRODE REM PT RTRN 9FT ADLT (ELECTROSURGICAL) ×1 IMPLANT
ELIMINATOR HOLE APEX DEPUY (Hips) ×1 IMPLANT
FACESHIELD WRAPAROUND (MASK) ×6 IMPLANT
FACESHIELD WRAPAROUND OR TEAM (MASK) ×2 IMPLANT
GLOVE SRG 8 PF TXTR STRL LF DI (GLOVE) ×2 IMPLANT
GLOVE SURG ORTHO LTX SZ7.5 (GLOVE) ×4 IMPLANT
GLOVE SURG UNDER POLY LF SZ8 (GLOVE) ×4
GOWN STRL REUS W/ TWL LRG LVL3 (GOWN DISPOSABLE) ×1 IMPLANT
GOWN STRL REUS W/ TWL XL LVL3 (GOWN DISPOSABLE) ×1 IMPLANT
GOWN STRL REUS W/TWL 2XL LVL3 (GOWN DISPOSABLE) ×2 IMPLANT
GOWN STRL REUS W/TWL LRG LVL3 (GOWN DISPOSABLE) ×2
GOWN STRL REUS W/TWL XL LVL3 (GOWN DISPOSABLE) ×2
HEAD FEM STD 32X+1 STRL (Hips) ×1 IMPLANT
KIT BASIN OR (CUSTOM PROCEDURE TRAY) ×2 IMPLANT
KIT TURNOVER KIT B (KITS) ×2 IMPLANT
LINER ACET PNNCL PLUS4 NEUTRAL (Hips) IMPLANT
MANIFOLD NEPTUNE II (INSTRUMENTS) ×2 IMPLANT
NDL HYPO 21X1 ECLIPSE (NEEDLE) ×1 IMPLANT
NEEDLE HYPO 21X1 ECLIPSE (NEEDLE) ×2 IMPLANT
NS IRRIG 1000ML POUR BTL (IV SOLUTION) ×2 IMPLANT
PACK TOTAL JOINT (CUSTOM PROCEDURE TRAY) ×2 IMPLANT
PAD ARMBOARD 7.5X6 YLW CONV (MISCELLANEOUS) ×4 IMPLANT
PINNACLE PLUS 4 NEUTRAL (Hips) ×2 IMPLANT
SPONGE T-LAP 18X18 ~~LOC~~+RFID (SPONGE) ×2 IMPLANT
STAPLER VISISTAT 35W (STAPLE) ×1 IMPLANT
STEM FEMORAL SZ 6MM STD ACTIS (Stem) ×1 IMPLANT
STRIP CLOSURE SKIN 1/2X4 (GAUZE/BANDAGES/DRESSINGS) ×1 IMPLANT
SUT VIC AB 0 CT1 27 (SUTURE) ×2
SUT VIC AB 0 CT1 27XBRD ANBCTR (SUTURE) ×1 IMPLANT
SUT VIC AB 2-0 CT1 27 (SUTURE)
SUT VIC AB 2-0 CT1 TAPERPNT 27 (SUTURE) ×1 IMPLANT
SUT VICRYL 4-0 PS2 18IN ABS (SUTURE) ×1 IMPLANT
SUT VLOC 180 0 24IN GS25 (SUTURE) ×2 IMPLANT
SYR 20CC LL (SYRINGE) ×2 IMPLANT
TOWEL GREEN STERILE (TOWEL DISPOSABLE) ×4 IMPLANT
TOWEL GREEN STERILE FF (TOWEL DISPOSABLE) ×2 IMPLANT
TRAY CATH 16FR W/PLASTIC CATH (SET/KITS/TRAYS/PACK) IMPLANT
TRAY FOLEY MTR SLVR 14FR STAT (SET/KITS/TRAYS/PACK) ×1 IMPLANT
TRAY FOLEY MTR SLVR 16FR STAT (SET/KITS/TRAYS/PACK) IMPLANT
WATER STERILE IRR 1000ML POUR (IV SOLUTION) ×4 IMPLANT

## 2021-08-18 NOTE — Anesthesia Procedure Notes (Signed)
Spinal ? ?Patient location during procedure: OR ?End time: 08/18/2021 12:47 PM ?Reason for block: surgical anesthesia ?Staffing ?Performed: anesthesiologist  ?Anesthesiologist: Annye Asa, MD ?Preanesthetic Checklist ?Completed: patient identified, IV checked, site marked, risks and benefits discussed, surgical consent, monitors and equipment checked, pre-op evaluation and timeout performed ?Spinal Block ?Patient position: sitting ?Prep: DuraPrep and site prepped and draped ?Patient monitoring: blood pressure, continuous pulse ox, cardiac monitor and heart rate ?Approach: midline ?Location: L3-4 ?Injection technique: single-shot ?Needle ?Needle type: Pencan and Introducer  ?Needle gauge: 24 G ?Needle length: 9 cm ?Assessment ?Events: CSF return ?Additional Notes ?Pt identified in Operating room.  Monitors applied. Working IV access confirmed. Sterile prep, drape lumbar spine.  1% lido local L 3,4.  #24ga Pencan into clear CSF L 3,4.  '12mg'$  0.75% Bupivacaine with dextrose injected with asp CSF beginning and end of injection.  Patient asymptomatic, VSS, no heme aspirated, tolerated well.  Jenita Seashore, MD ?  ? ? ? ?

## 2021-08-18 NOTE — Interval H&P Note (Signed)
History and Physical Interval Note: ? ?08/18/2021 ?12:22 PM ? ?[name]  has presented today for surgery, with the diagnosis of Osteoarthritis Left Hip.  The various methods of treatment have been discussed with the patient and family. After consideration of risks, benefits and other options for treatment, the patient has consented to  Procedure(s): ?Left TOTAL HIP ARTHROPLASTY ANTERIOR APPROACH (Left) as a surgical intervention.  The patient's history has been reviewed, patient examined, no change in status, stable for surgery.  I have reviewed the patient's chart and labs.  Questions were answered to the patient's satisfaction.   ? ? ?Taylor Cohen ? ? ?

## 2021-08-18 NOTE — Anesthesia Preprocedure Evaluation (Signed)
Anesthesia Evaluation  ?Patient identified by MRN, date of birth, ID band ?Patient awake ? ? ? ?Reviewed: ?Allergy & Precautions, NPO status , Patient's Chart, lab work & pertinent test results ? ?History of Anesthesia Complications ?Negative for: history of anesthetic complications ? ?Airway ?Mallampati: II ? ?TM Distance: >3 FB ?Neck ROM: Full ? ? ? Dental ? ?(+) Missing, Chipped, Dental Advisory Given ?  ?Pulmonary ?asthma , COPD,  ?  ?breath sounds clear to auscultation ? ? ? ? ? ? Cardiovascular ?hypertension, Pt. on medications ?(-) angina ?Rhythm:Regular Rate:Normal ? ? ?  ?Neuro/Psych ?negative neurological ROS ?   ? GI/Hepatic ?Neg liver ROS, GERD  Controlled,  ?Endo/Other  ?diabetes, Oral Hypoglycemic Agents ? Renal/GU ?negative Renal ROS  ? ?  ?Musculoskeletal ? ?(+) Arthritis ,  ? Abdominal ?  ?Peds ? Hematology ?negative hematology ROS ?(+)   ?Anesthesia Other Findings ?H/o breast cancer ? Reproductive/Obstetrics ? ?  ? ? ? ? ? ? ? ? ? ? ? ? ? ?  ?  ? ? ? ? ? ? ? ? ?Anesthesia Physical ?Anesthesia Plan ? ?ASA: 3 ? ?Anesthesia Plan: Spinal  ? ?Post-op Pain Management: Tylenol PO (pre-op)*  ? ?Induction:  ? ?PONV Risk Score and Plan: 2 and Ondansetron and Treatment may vary due to age or medical condition ? ?Airway Management Planned: Natural Airway and Simple Face Mask ? ?Additional Equipment: None ? ?Intra-op Plan:  ? ?Post-operative Plan:  ? ?Informed Consent: I have reviewed the patients History and Physical, chart, labs and discussed the procedure including the risks, benefits and alternatives for the proposed anesthesia with the patient or authorized representative who has indicated his/her understanding and acceptance.  ? ? ? ?Dental advisory given ? ?Plan Discussed with: CRNA and Surgeon ? ?Anesthesia Plan Comments:   ? ? ? ? ? ? ?Anesthesia Quick Evaluation ? ?

## 2021-08-18 NOTE — Op Note (Signed)
Pre and postop diagnosis left hip primary osteoarthritis ? ?Procedure: Left total hip arthroplasty, direct anterior approach ? ?Surgeon: Rodell Perna, MD ? ?Assistant: Benjiman Core, PA-C medically necessary and present for the entire procedure ? ?Anesthesia spinal anesthesia +10 cc Marcaine 10 cc Exparel ? ?EBL less than 300 cc ? ?Implants:Implants ? ?ELIMINATOR HOLE APEX DEPUY - [id number] ? ?Inventory Item: ELIMINATOR HOLE APEX DEPUY Serial no.:  Model/Cat no.: [id number]  ?Implant nameBrock Bad APEX DEPUY - [id number] Laterality: Left Area: Hip  ?Manufacturer: Dewitt Hoes Date of Manufacture:    ?Action: Implanted Number Used: 1   ?Device Identifier:  Device Identifier Type:    ? ?CUP SECTOR GRIPTON 50MM - [id number] ? ?Inventory Item: CUP SECTOR GRIPTON 50MM Serial no.:  Model/Cat no.: [id number]  ?Implant name: CUP SECTOR GRIPTON 50MM - [id number] Laterality: Left Area: Hip  ?Manufacturer: Dewitt Hoes Date of Manufacture:    ?Action: Implanted Number Used: 1   ?Device Identifier:  Device Identifier Type:    ? ?PINNACLE PLUS 4 NEUTRAL - [id number] ? ?Inventory Item: PINNACLE PLUS 4 NEUTRAL Serial no.:  Model/Cat no.: [id number]  ?Implant name: PINNACLE PLUS 4 NEUTRAL - [id number] Laterality: Left Area: Hip  ?Manufacturer: Dewitt Hoes Date of Manufacture:    ?Action: Implanted Number Used: 1   ?Device Identifier:  Device Identifier Type:    ? ?STEM FEMORAL SZ 6MM STD ACTIS - [id number] ? ?Inventory Item: STEM FEMORAL SZ 6MM STD ACTIS Serial no.:  Model/Cat no.: [id number]  ?Implant name: STEM FEMORAL SZ 6MM STD ACTIS - [id number] Laterality: Left Area: Hip  ?Manufacturer: Dewitt Hoes Date of Manufacture:    ?Action: Implanted Number Used: 1   ?Device Identifier:  Device Identifier Type:    ? ?HEAD FEM STD 32X+1 STRL - [id number] ? ?Inventory Item: HEAD FEM STD 32X+1 STRL Serial no.:  Model/Cat no.: [id number]  ?Implant name: HEAD FEM STD 32X+1 STRL - [id number] Laterality: Left Area: Hip   ?Manufacturer: Dewitt Hoes Date of Manufacture:    ?Action: Implanted Number Used: 1   ?Device Identifier:  Device Identifier Type:    ?After induction of spinal anesthesia patient had Foley catheter placed on a bleed small size placed on the Hana table careful padding positioning C-arm was brought in.  Abdomen was taped over 1015 drapes were applied above and below.  Both hips to be visualized patient was a few millimeters short due to erosive wear of the joint on the left and minimal arthritic changes with maintained joint space on the opposite right hip. ? ?DuraPrep was used normal direct anterior hip sheets drapes large shower curtain Betadine Steri-Drape sheet across and above hydraulic arm was attached and sealed.  Timeout procedure was completed IV TXA was given.  Ancef 2 g. ? ?Incision was made 1 fingerbreadth lateral 1 inferior to the ASIS obliquely over the trochanter fascia was nicked extended in line with the skin incision.  Blunt retractor placed interval over the top of the capsule transverse bleeders were coagulated anterior capsule was opened there was a gush of clear fluid osteoarthritic spurs were noted erosive changes in the head and the neck was cut with C-arm visualization with a low millimeter neck cut.  The head was removed with a corkscrew sequential reaming progressing up to a 49 reamer for 50.  No dome screw was needed.  Apex eliminator was placed and since patient had some offset of +4 liner was inserted impacted after [facility] appropriately tested and was secure.  Leg was externally rotated 774 hydraulic  Lacinda Axon was applied taken down and under and then the hydraulics bring in the hip up.  Mueller retractor medial and extra long retractors placed behind the trochanter.  Posterior capsule was opened external rotators were saved.  Cookie cutter rondure large curette lateralization followed by progressing slowly up to size 5 was checked under C arm look like a 6 or probably could be fit  wheeze cookie-cutter more lateralization of the trochanter finally 6 was placed calcar reamer was used.  Leg lengths were equal by measurement on C arm.  #6 was placed with a +1 mm 32 mm ball.  Reduction external rotation 90 degrees halfway down to the floor with no subluxation or stability.  Cup was secure femoral stem had good fit AP lateral was checked for final spot pictures taking patient leg lengths irrigation V-Loc closure 2-0 Vicryl subtenons tissue.  Skin staple closure postop dressing and transferred to cover room.  Foley will be left in until tomorrow a.m. since patient occasionally has difficulty with bladder control.  Patient was transferred recovery in stable condition. ?

## 2021-08-18 NOTE — Transfer of Care (Signed)
Immediate Anesthesia Transfer of Care Note ? ?Patient: Taylor Cohen ? ?Procedure(s) Performed: Left TOTAL HIP ARTHROPLASTY ANTERIOR APPROACH (Left: Hip) ? ?Patient Location: PACU ? ?Anesthesia Type:MAC and Spinal ? ?Level of Consciousness: awake and alert  ? ?Airway & Oxygen Therapy: Patient Spontanous Breathing ? ?Post-op Assessment: Report given to RN and Post -op Vital signs reviewed and stable ? ?Post vital signs: Reviewed and stable ? ?Last Vitals:  ?Vitals Value Taken Time  ?BP 107/57 08/18/21 1438  ?Temp    ?Pulse 62 08/18/21 1443  ?Resp 17 08/18/21 1443  ?SpO2 97 % 08/18/21 1443  ?Vitals shown include unvalidated device data. ? ?Last Pain:  ?Vitals:  ? 08/18/21 1038  ?TempSrc:   ?PainSc: 8   ?   ? ?Patients Stated Pain Goal: 0 (08/18/21 1038) ? ?Complications: No notable events documented. ?

## 2021-08-18 NOTE — Plan of Care (Signed)

## 2021-08-18 NOTE — Anesthesia Postprocedure Evaluation (Signed)
Anesthesia Post Note ? ?Patient: Taylor Cohen ? ?Procedure(s) Performed: Left TOTAL HIP ARTHROPLASTY ANTERIOR APPROACH (Left: Hip) ? ?  ? ?Patient location during evaluation: PACU ?Anesthesia Type: Spinal ?Level of consciousness: awake and alert, patient cooperative and oriented ?Pain control: pain improving. ?Vital Signs Assessment: post-procedure vital signs reviewed and stable ?Respiratory status: spontaneous breathing, nonlabored ventilation and respiratory function stable ?Cardiovascular status: blood pressure returned to baseline and stable ?Postop Assessment: no apparent nausea or vomiting, patient able to bend at knees and spinal receding ?Anesthetic complications: no ? ? ?No notable events documented. ? ?Last Vitals:  ?Vitals:  ? 08/18/21 1440 08/18/21 1455  ?BP: (!) 107/57 115/68  ?Pulse: 65 61  ?Resp: 17 16  ?Temp: 37.1 ?C   ?SpO2: 98% 94%  ?  ?Last Pain:  ?Vitals:  ? 08/18/21 1440  ?TempSrc:   ?PainSc: 10-Worst pain ever  ? ? ?  ?  ?  ?  ?  ?  ? ?Sama Arauz,E. Sylvanus Telford ? ? ? ? ?

## 2021-08-19 ENCOUNTER — Encounter (HOSPITAL_COMMUNITY): Payer: Self-pay | Admitting: Orthopaedic Surgery

## 2021-08-19 DIAGNOSIS — Z7984 Long term (current) use of oral hypoglycemic drugs: Secondary | ICD-10-CM | POA: Diagnosis not present

## 2021-08-19 DIAGNOSIS — M1612 Unilateral primary osteoarthritis, left hip: Secondary | ICD-10-CM | POA: Diagnosis not present

## 2021-08-19 DIAGNOSIS — Z79899 Other long term (current) drug therapy: Secondary | ICD-10-CM | POA: Diagnosis not present

## 2021-08-19 DIAGNOSIS — J45909 Unspecified asthma, uncomplicated: Secondary | ICD-10-CM | POA: Diagnosis not present

## 2021-08-19 DIAGNOSIS — I10 Essential (primary) hypertension: Secondary | ICD-10-CM | POA: Diagnosis not present

## 2021-08-19 DIAGNOSIS — Z853 Personal history of malignant neoplasm of breast: Secondary | ICD-10-CM | POA: Diagnosis not present

## 2021-08-19 DIAGNOSIS — E119 Type 2 diabetes mellitus without complications: Secondary | ICD-10-CM | POA: Diagnosis not present

## 2021-08-19 DIAGNOSIS — R531 Weakness: Secondary | ICD-10-CM | POA: Diagnosis not present

## 2021-08-19 LAB — BASIC METABOLIC PANEL
Anion gap: 10 (ref 5–15)
BUN: 12 mg/dL (ref 8–23)
CO2: 24 mmol/L (ref 22–32)
Calcium: 8.8 mg/dL — ABNORMAL LOW (ref 8.9–10.3)
Chloride: 100 mmol/L (ref 98–111)
Creatinine, Ser: 0.68 mg/dL (ref 0.44–1.00)
GFR, Estimated: >60 mL/min (ref >60)
Glucose, Bld: 162 mg/dL — ABNORMAL HIGH (ref 70–99)
Potassium: 3.9 mmol/L (ref 3.5–5.1)
Sodium: 134 mmol/L — ABNORMAL LOW (ref 135–145)

## 2021-08-19 LAB — CBC
HCT: 34.3 % — ABNORMAL LOW (ref 36.0–46.0)
Hemoglobin: 11.2 g/dL — ABNORMAL LOW (ref 12.0–15.0)
MCH: 26.9 pg (ref 26.0–34.0)
MCHC: 32.7 g/dL (ref 30.0–36.0)
MCV: 82.3 fL (ref 80.0–100.0)
Platelets: 189 10*3/uL (ref 150–400)
RBC: 4.17 MIL/uL (ref 3.87–5.11)
RDW: 14.7 % (ref 11.5–15.5)
WBC: 10.4 10*3/uL (ref 4.0–10.5)
nRBC: 0 % (ref 0.0–0.2)

## 2021-08-19 LAB — GLUCOSE, CAPILLARY
Glucose-Capillary: 164 mg/dL — ABNORMAL HIGH (ref 70–99)
Glucose-Capillary: 182 mg/dL — ABNORMAL HIGH (ref 70–99)
Glucose-Capillary: 125 mg/dL — ABNORMAL HIGH (ref 70–99)

## 2021-08-19 MED ORDER — OXYCODONE HCL 5 MG PO TABS
5.0000 mg | ORAL_TABLET | ORAL | Status: DC | PRN
  Administered 2021-08-19 – 2021-08-20 (×5): 10 mg via ORAL
  Filled 2021-08-19 (×5): qty 2

## 2021-08-19 NOTE — TOC Initial Note (Signed)
Transition of Care (TOC) - Initial/Assessment Note  ? ? ?Patient Details  ?Name: Taylor Cohen ?MRN: [id number] ?Date of Birth: 21-Aug-1955 ? ?Transition of Care [facility]) CM/SW Contact:    ?Sharin Mons, RN ?Phone Number: ?08/19/2021, 2:17 PM ? ?Clinical Narrative:                 ?    ?-s/p Left total hip arthroplasty,3/29 ?From home with son, Vincente Liberty. Son states he will assist with care once d/c. DME: RW and BSC , referral made with [employer]. Equipment will be delivered to bedside prior to d/c. ?Pt without concerns affording Rx meds. ?Pt has transportation to home when d/c. ? ?TOC team  following for TOC needs..... ? ?Expected Discharge Plan: Home/Self Care ?Barriers to Discharge: Continued Medical Work up ? ? ?Patient Goals and CMS Choice ?  ?  ?Choice offered to / list presented to : Patient ? ?Expected Discharge Plan and Services ?Expected Discharge Plan: Home/Self Care ?  ?Discharge Planning Services: CM Consult ?  ?Living arrangements for the past 2 months: [place] ?                ?DME Arranged: 3-N-1, Walker rolling ?DME Agency: [employer] ?Date DME Agency Contacted: 08/19/21 ?Time DME Agency Contacted: 0300 ?Representative spoke with at DME Agency: Maudie Mercury ?  ?  ?  ?  ?  ? ?Prior Living Arrangements/Services ?Living arrangements for the past 2 months: [place] ?Lives with:: Adult Children ?Patient language and need for interpreter reviewed:: Yes ?Do you feel safe going back to the place where you live?: Yes      ?Need for Family Participation in Patient Care: Yes (Comment) ?Care giver support system in place?: Yes (comment) ?  ?Criminal Activity/Legal Involvement Pertinent to Current Situation/Hospitalization: No - Comment as needed ? ?Activities of Daily Living ?Home Assistive Devices/Equipment: Cane (specify quad or straight) ?ADL Screening (condition at time of admission) ?Patient's cognitive ability adequate to safely complete daily activities?: Yes ?Is the patient deaf or  have difficulty hearing?: No ?Does the patient have difficulty seeing, even when wearing glasses/contacts?: No ?Does the patient have difficulty concentrating, remembering, or making decisions?: No ?Patient able to express need for assistance with ADLs?: Yes ?Does the patient have difficulty dressing or bathing?: No ?Independently performs ADLs?: Yes (appropriate for developmental age) ?Does the patient have difficulty walking or climbing stairs?: Yes ?Weakness of Legs: Both ?Weakness of Arms/Hands: None ? ?Permission Sought/Granted ?  ?Permission granted to share information with : Yes, Verbal Permission Granted ? Share Information with NAME: Kasandra Knudsen (Son)   [phone] ?   ?   ?   ? ?Emotional Assessment ?Appearance:: Appears stated age ?  ?  ?Orientation: : Oriented to Self, Oriented to Place, Oriented to  Time, Oriented to Situation ?Alcohol / Substance Use: Not Applicable ?Psych Involvement: No (comment) ? ?Admission diagnosis:  Arthritis of left hip [M16.12] ?Patient Active Problem List  ? Diagnosis Date Noted  ? Arthritis of left hip 08/18/2021  ? Unilateral primary osteoarthritis, left hip 07/01/2021  ? Dysphagia 02/15/2021  ? ?PCP:  Neale Burly, MD ?Pharmacy:   ?[facility] [address] ?[place] ?[place] [address] ?Phone: [phone] Fax: [phone] ? ? ? ? ?Social Determinants of Health (SDOH) Interventions ?  ? ?Readmission Risk Interventions ?   ? View : No data to display.  ?  ?  ?  ? ? ? ?

## 2021-08-19 NOTE — Progress Notes (Addendum)
Physical Therapy Treatment ?Patient Details ?Name: Taylor Cohen ?MRN: [id number] ?DOB: 17-Jun-1955 ?Today's Date: 08/19/2021 ? ? ?History of Present Illness Pt is a 66 y.o. female admitted 08/18/21 for elective L THA (direct anterior approach). PMH includes HTN, DM, asthma, breast CA. ?  ?PT Comments  ? ? Pt progressing with mobility. Today's session focused on continued transfer and gait training, pt walking 120' with RW and min guard. Pt remains limited by pain, although declining PRN IV pain meds from RN. Will continue to follow acutely to address established goals. ?   ?Recommendations for follow up therapy are one component of a multi-disciplinary discharge planning process, led by the attending physician.  Recommendations may be updated based on patient status, additional functional criteria and insurance authorization. ? ?Follow Up Recommendations ? Follow physician's recommendations for discharge plan and follow up therapies ?  ?  ?Assistance Recommended at Discharge Intermittent Supervision/Assistance  ?Patient can return home with the following Assistance with cooking/housework;Assist for transportation;Help with stairs or ramp for entrance;A little help with walking and/or transfers;A little help with bathing/dressing/bathroom ?  ?Equipment Recommendations ? Rolling walker (2 wheels);BSC/3in1 (already delivered)  ?  ?Recommendations for Other Services   ? ? ?  ?Precautions / Restrictions Precautions ?Precautions: Fall ?Restrictions ?Weight Bearing Restrictions: Yes ?LLE Weight Bearing: Weight bearing as tolerated  ?  ? ?Mobility ? Bed Mobility ?Overal bed mobility: Needs Assistance ?Bed Mobility: Supine to Sit ?  ?  ?Supine to sit: Mod assist, HOB elevated ?  ?  ?General bed mobility comments: ModA for LLE management; increased time and effort secondary to LLE pain ?  ? ?Transfers ?Overall transfer level: Needs assistance ?Equipment used: Rolling walker (2 wheels) ?Transfers: Sit to/from Stand ?Sit to  Stand: Min guard ?  ?  ?  ?  ?  ?General transfer comment: multiple sit<>stands from EOB, BSC (over toilet) and recliner; initial cues for hand placement with good carryover to subsequent trials ?  ? ?Ambulation/Gait ?Ambulation/Gait assistance: Min guard ?Gait Distance (Feet): 120 Feet ?Assistive device: Rolling walker (2 wheels) ?Gait Pattern/deviations: Step-to pattern, Step-through pattern, Decreased weight shift to left, Antalgic, Trunk flexed ?Gait velocity: Decreased ?  ?Pre-gait activities: initial weight shifts standing EOB with RW and min guard before progressing ambulation ?General Gait Details: Significantly slowed, antalgic gait with RW and min guard for balance; improved ability to perform step through gait pattern, intermittent cues for sequencing with RW ? ? ?Stairs ?  ?  ?  ?  ?  ? ? ?Wheelchair Mobility ?  ? ?Modified Rankin (Stroke Patients Only) ?  ? ? ?  ?Balance Overall balance assessment: Needs assistance ?Sitting-balance support: No upper extremity supported, Feet supported ?Sitting balance-Leahy Scale: Fair ?Sitting balance - Comments: able to perform pericare sitting on BSC ?  ?Standing balance support: Reliant on assistive device for balance, No upper extremity supported ?Standing balance-Leahy Scale: Good ?Standing balance comment: can static stand at sink to wash hands without UE support; static and dynamic stability improved with RW ?  ?  ?  ?  ?  ?  ?  ?  ?  ?  ?  ?  ? ?  ?Cognition Arousal/Alertness: Awake/alert ?Behavior During Therapy: [place] for tasks assessed/performed ?Overall Cognitive Status: Within Functional Limits for tasks assessed ?  ?  ?  ?  ?  ?  ?  ?  ?  ?  ?  ?  ?  ?  ?  ?  ?General Comments: WFL for majority of  simple tasks, internally distracted by pain; speech difficult to understand at times, although pt also moaning in pain at times ?  ?  ? ?  ?Exercises Total Joint Exercises ?Ankle Circles/Pumps: AROM, Both, Seated ?Long Arc Quad: AROM, Left, Seated ? ?  ?General  Comments   ?  ?  ? ?Pertinent Vitals/Pain Pain Assessment ?Pain Assessment: Faces ?Faces Pain Scale: Hurts even more ?Pain Location: L hip ?Pain Descriptors / Indicators: Discomfort, Grimacing, Operative site guarding, Moaning ?Pain Intervention(s): Patient requesting pain meds-RN notified (pt then declined PRN pain med from RN)  ? ? ?Home Living   ?  ?  ?  ?  ?  ?  ?  ?  ?  ?   ?  ?Prior Function    ?  ?  ?   ? ?PT Goals (current goals can now be found in the care plan section) Progress towards PT goals: Progressing toward goals ? ?  ?Frequency ? ? ? 7X/week ? ? ? ?  ?PT Plan Current plan remains appropriate  ? ? ?Co-evaluation   ?  ?  ?  ?  ? ?  ?AM-PAC PT "6 Clicks" Mobility   ?Outcome Measure ? Help needed turning from your back to your side while in a flat bed without using bedrails?: A Lot ?Help needed moving from lying on your back to sitting on the side of a flat bed without using bedrails?: A Lot ?Help needed moving to and from a bed to a chair (including a wheelchair)?: A Little ?Help needed standing up from a chair using your arms (e.g., wheelchair or bedside chair)?: A Little ?Help needed to walk in hospital room?: A Little ?Help needed climbing 3-5 steps with a railing? : A Little ?6 Click Score: 16 ? ?  ?End of Session Equipment Utilized During Treatment: Gait belt ?Activity Tolerance: Patient tolerated treatment well ?Patient left: in chair;with call bell/phone within reach;with chair alarm set ?Nurse Communication: Mobility status ?PT Visit Diagnosis: Other abnormalities of gait and mobility (R26.89);Pain ?Pain - Right/Left: Left ?Pain - part of body: Hip ?  ? ? ?Time: 3818-2993 ?PT Time Calculation (min) (ACUTE ONLY): 33 min ? ?Charges:  $Gait Training: 8-22 mins ?$Therapeutic Activity: 8-22 mins          ?          ? ?Mabeline Caras, PT, DPT ?Acute Rehabilitation Services  ?Pager [phone] ?Office [phone] ? ?[name] ?08/19/2021, 5:10 PM ? ?

## 2021-08-19 NOTE — Evaluation (Signed)
Physical Therapy Evaluation ?Patient Details ?Name: Taylor Cohen ?MRN: [id number] ?DOB: 07/08/1955 ?Today's Date: 08/19/2021 ? ?History of Present Illness ? Pt is a 66 y.o. female admitted 08/18/21 for elective L THA (direct anterior approach). PMH includes HTN, DM, asthma, breast CA. ?  ?Clinical Impression ? Pt presents with an overall decrease in functional mobility secondary to above. PTA, pt mod indep with SPC, lives with son, enjoys cooking. Initiated educ re: precautions, positioning, therex, activity recommendations. Today, pt able to initiate transfer and gait training with RW; pt moving well, primarily limited by L hip pain. Pt would benefit from continued acute PT services to maximize functional mobility and independence prior to d/c home.     ? ?Recommendations for follow up therapy are one component of a multi-disciplinary discharge planning process, led by the attending physician.  Recommendations may be updated based on patient status, additional functional criteria and insurance authorization. ? ?Follow Up Recommendations Follow physician's recommendations for discharge plan and follow up therapies ? ?  ?Assistance Recommended at Discharge Intermittent Supervision/Assistance  ?Patient can return home with the following ? Assistance with cooking/housework;Assist for transportation;Help with stairs or ramp for entrance;A little help with walking and/or transfers;A little help with bathing/dressing/bathroom ? ?  ?Equipment Recommendations Rolling walker (2 wheels);BSC/3in1  ?Recommendations for Other Services ?    ?  ?Functional Status Assessment Patient has had a recent decline in their functional status and demonstrates the ability to make significant improvements in function in a reasonable and predictable amount of time.  ? ?  ?Precautions / Restrictions Precautions ?Precautions: Fall ?Restrictions ?Weight Bearing Restrictions: Yes ?LLE Weight Bearing: Weight bearing as tolerated  ? ?   ? ?Mobility ? Bed Mobility ?Overal bed mobility: Needs Assistance ?Bed Mobility: Supine to Sit ?  ?  ?Supine to sit: Mod assist, HOB elevated ?  ?  ?General bed mobility comments: ModA for LLE management, cues for sequencing, heavy use of bed rail; increased time and effort secondary to LLE pain ?  ? ?Transfers ?Overall transfer level: Needs assistance ?Equipment used: Rolling walker (2 wheels) ?Transfers: Sit to/from Stand ?Sit to Stand: Min assist ?  ?  ?  ?  ?  ?General transfer comment: cues for hand placement, minA for trunk elevation and stability standing from EOB to RW; good eccentric control sitting in recliner ?  ? ?Ambulation/Gait ?Ambulation/Gait assistance: Min guard ?Gait Distance (Feet): 32 Feet ?Assistive device: Rolling walker (2 wheels) ?Gait Pattern/deviations: Step-to pattern, Decreased weight shift to left, Decreased dorsiflexion - left, Antalgic, Trunk flexed ?Gait velocity: Decreased ?  ?Pre-gait activities: initial weight shifts standing EOB with RW and min guard before progressing ambulation ?General Gait Details: Slow, antalgic gait with RW and min guard for balance; cues for sequencing with RW; pt with some incorporation for cues for gait pattern, although limited by pain ? ?Stairs ?  ?  ?  ?  ?  ? ?Wheelchair Mobility ?  ? ?Modified Rankin (Stroke Patients Only) ?  ? ?  ? ?Balance Overall balance assessment: Needs assistance ?  ?Sitting balance-Leahy Scale: Fair ?  ?  ?Standing balance support: Reliant on assistive device for balance ?Standing balance-Leahy Scale: Poor ?  ?  ?  ?  ?  ?  ?  ?  ?  ?  ?  ?  ?   ? ? ? ?Pertinent Vitals/Pain Pain Assessment ?Pain Assessment: Faces ?Faces Pain Scale: Hurts even more ?Pain Location: L hip ?Pain Descriptors / Indicators: Discomfort, Grimacing, Operative site  guarding, Moaning ?Pain Intervention(s): Monitored during session, Limited activity within patient's tolerance, Repositioned  ? ? ?Home Living Family/patient expects to be discharged to::  Private residence ?Living Arrangements: Children ?Available Help at Discharge: Family;Available 24 hours/day ?Type of Home: House ?Home Access: Stairs to enter ?Entrance Stairs-Rails: None (wall pt holds onto) ?Entrance Stairs-Number of Steps: 3 ?  ?Home Layout: One level ?Home Equipment: Kasandra Knudsen - single point ?Additional Comments: Lives with son who plans to take off work to be home first few days  ?  ?Prior Function Prior Level of Function : Independent/Modified Independent ?  ?  ?  ?  ?  ?  ?Mobility Comments: Mod indep with SPC ?ADLs Comments: Typically mod indep; sometimes has difficulty reaching feet ?  ? ? ?Hand Dominance  ?   ? ?  ?Extremity/Trunk Assessment  ? Upper Extremity Assessment ?Upper Extremity Assessment: Overall WFL for tasks assessed ?  ? ?Lower Extremity Assessment ?Lower Extremity Assessment: LLE deficits/detail ?LLE Deficits / Details: s/p L THA with expected post-op pain and weakness; hip with <3/5 strength while supine; able to perform near full knee ext seated; ankle pumps WFL ?  ? ?Cervical / Trunk Assessment ?Cervical / Trunk Assessment: Kyphotic  ?Communication  ? Communication: Expressive difficulties  ?Cognition Arousal/Alertness: Awake/alert ?Behavior During Therapy: [facility] for tasks assessed/performed, Flat affect ?Overall Cognitive Status: Within Functional Limits for tasks assessed ?  ?  ?  ?  ?  ?  ?  ?  ?  ?  ?  ?  ?  ?  ?  ?  ?General Comments: WFL for majority of simple tasks, internally distracted by pain; speech difficult to understand at times, although pt also moaning in pain at times ?  ?  ? ?  ?General Comments General comments (skin integrity, edema, etc.): initiated post-op THA education re: precautions, positioning, therex, DVT prevention, activity recommendations. pt reports unsure of plan for follow-up PT; will need RW and 3in1; son able to provide necessary support ? ?  ?Exercises Total Joint Exercises ?Ankle Circles/Pumps: AROM, Both, Seated ?Long Arc Quad: AROM, Left,  Seated  ? ?Assessment/Plan  ?  ?PT Assessment Patient needs continued PT services  ?PT Problem List Decreased strength;Decreased range of motion;Decreased activity tolerance;Decreased balance;Decreased mobility;Decreased knowledge of use of DME;Decreased knowledge of precautions;Pain ? ?   ?  ?PT Treatment Interventions DME instruction;Gait training;Stair training;Functional mobility training;Therapeutic activities;Therapeutic exercise;Balance training;Patient/family education   ? ?PT Goals (Current goals can be found in the Care Plan section)  ?Acute Rehab PT Goals ?Patient Stated Goal: return home with son's assist ?PT Goal Formulation: With patient ?Time For Goal Achievement: 09/02/21 ?Potential to Achieve Goals: Good ? ?  ?Frequency 7X/week ?  ? ? ?Co-evaluation   ?  ?  ?  ?  ? ? ?  ?AM-PAC PT "6 Clicks" Mobility  ?Outcome Measure Help needed turning from your back to your side while in a flat bed without using bedrails?: A Lot ?Help needed moving from lying on your back to sitting on the side of a flat bed without using bedrails?: A Lot ?Help needed moving to and from a bed to a chair (including a wheelchair)?: A Little ?Help needed standing up from a chair using your arms (e.g., wheelchair or bedside chair)?: A Little ?Help needed to walk in hospital room?: A Little ?Help needed climbing 3-5 steps with a railing? : A Little ?6 Click Score: 16 ? ?  ?End of Session Equipment Utilized During Treatment: Gait belt ?Activity Tolerance:  Patient tolerated treatment well ?Patient left: in chair;with call bell/phone within reach;with chair alarm set ?Nurse Communication: Mobility status ?PT Visit Diagnosis: Other abnormalities of gait and mobility (R26.89);Pain ?Pain - Right/Left: Left ?Pain - part of body: Hip ?  ? ?Time: 0947-0962 ?PT Time Calculation (min) (ACUTE ONLY): 25 min ? ? ?Charges:   PT Evaluation ?$PT Eval Low Complexity: 1 Low ?PT Treatments ?$Gait Training: 8-22 mins ?  ?   ?Mabeline Caras, PT, DPT ?Acute  Rehabilitation Services  ?Pager [phone] ?Office [phone] ? ?[name] ?08/19/2021, 12:27 PM ? ?

## 2021-08-19 NOTE — Progress Notes (Signed)
Patient ID: Taylor Cohen, female   DOB: Apr 25, 1956, 66 y.o.   MRN: [id number] ?Walk 32 feet.  Son states she is sleeping currently.  Changed to admission status  likely and hopeful discharge tomorrow. ?

## 2021-08-19 NOTE — Progress Notes (Signed)
Patient ID: Taylor Cohen, female   DOB: Oct 17, 1955, 66 y.o.   MRN: [id number] ? ? ?Subjective: ?1 Day Post-Op Procedure(s) (LRB): ?Left TOTAL HIP ARTHROPLASTY ANTERIOR APPROACH (Left) ?Patient reports pain as moderate.   ? ?Objective: ?Vital signs in last 24 hours: ?Temp:  [97.6 ?F (36.4 ?C)-98.7 ?F (37.1 ?C)] 98.7 ?F (37.1 ?C) (03/30 0439) ?Pulse Rate:  [57-80] 80 (03/30 0439) ?Resp:  [12-20] 17 (03/30 0439) ?BP: (94-119)/(52-96) 114/62 (03/30 0439) ?SpO2:  [94 %-100 %] 94 % (03/30 0439) ?Weight:  [77.6 kg] 77.6 kg (03/29 1025) ? ?Intake/Output from previous day: ?03/29 0701 - 03/30 0700 ?In: 1940.1 [P.O.:470; I.V.:1470.1] ?Out: 1500 [Urine:1350; Blood:150] ?Intake/Output this shift: ?No intake/output data recorded. ? ?Recent Labs  ?  08/19/21 ?5465  ?HGB 11.2*  ? ?Recent Labs  ?  08/19/21 ?0354  ?WBC 10.4  ?RBC 4.17  ?HCT 34.3*  ?PLT 189  ? ?Recent Labs  ?  08/19/21 ?6568  ?NA 134*  ?K 3.9  ?CL 100  ?CO2 24  ?BUN 12  ?CREATININE 0.68  ?GLUCOSE 162*  ?CALCIUM 8.8*  ? ?No results for input(s): LABPT, INR in the last 72 hours. ? ?Neurologically intact ?DG C-Arm 1-60 Min-No Report ? ?Result Date: 08/18/2021 ?Fluoroscopy was utilized by the requesting physician.  No radiographic interpretation.  ? ?DG C-Arm 1-60 Min-No Report ? ?Result Date: 08/18/2021 ?Fluoroscopy was utilized by the requesting physician.  No radiographic interpretation.  ? ?DG HIP UNILAT WITH PELVIS 1V LEFT ? ?Result Date: 08/18/2021 ?CLINICAL DATA:  Left total hip arthroplasty. EXAM: DG HIP (WITH OR WITHOUT PELVIS) 1V*L* COMPARISON:  Left hip radiographs 06/24/2021 FLUOROSCOPY: Fluoroscopy Time: 20 seconds Radiation Exposure Index: 2.36 mGy FINDINGS: Four intraoperative fluoroscopic images are provided during left total hip arthroplasty. No acute complication is evident on these limited images. IMPRESSION: Intraoperative images during left total hip arthroplasty. Electronically Signed   By: Logan Bores M.D.   On: 08/18/2021 14:29  ? ?DG Hip Port  Unilat With Pelvis 1V Left ? ?Result Date: 08/18/2021 ?CLINICAL DATA:  LEFT total hip arthroplasty EXAM: DG HIP (WITH OR WITHOUT PELVIS) 1V PORT LEFT COMPARISON:  None. FINDINGS: Total hip arthroplasty. Prosthetic components appear well seated. No fracture. Expected postsurgical soft tissue changes. IMPRESSION: Total hip arthroplasty without complication. Electronically Signed   By: Suzy Bouchard M.D.   On: 08/18/2021 15:41   ? ?Assessment/Plan: ?1 Day Post-Op Procedure(s) (LRB): ?Left TOTAL HIP ARTHROPLASTY ANTERIOR APPROACH (Left) ?Up with therapy ? ?Marybelle Killings ?08/19/2021, 8:08 AM ? ? ? ? ? ?

## 2021-08-20 DIAGNOSIS — I10 Essential (primary) hypertension: Secondary | ICD-10-CM | POA: Diagnosis not present

## 2021-08-20 DIAGNOSIS — Z7984 Long term (current) use of oral hypoglycemic drugs: Secondary | ICD-10-CM | POA: Diagnosis not present

## 2021-08-20 DIAGNOSIS — Z79899 Other long term (current) drug therapy: Secondary | ICD-10-CM | POA: Diagnosis not present

## 2021-08-20 DIAGNOSIS — M1612 Unilateral primary osteoarthritis, left hip: Secondary | ICD-10-CM | POA: Diagnosis not present

## 2021-08-20 DIAGNOSIS — E119 Type 2 diabetes mellitus without complications: Secondary | ICD-10-CM | POA: Diagnosis not present

## 2021-08-20 DIAGNOSIS — J45909 Unspecified asthma, uncomplicated: Secondary | ICD-10-CM | POA: Diagnosis not present

## 2021-08-20 DIAGNOSIS — Z853 Personal history of malignant neoplasm of breast: Secondary | ICD-10-CM | POA: Diagnosis not present

## 2021-08-20 LAB — CBC
HCT: 34.7 % — ABNORMAL LOW (ref 36.0–46.0)
Hemoglobin: 11.5 g/dL — ABNORMAL LOW (ref 12.0–15.0)
MCH: 26.9 pg (ref 26.0–34.0)
MCHC: 33.1 g/dL (ref 30.0–36.0)
MCV: 81.3 fL (ref 80.0–100.0)
Platelets: 178 10*3/uL (ref 150–400)
RBC: 4.27 MIL/uL (ref 3.87–5.11)
RDW: 15 % (ref 11.5–15.5)
WBC: 9.5 10*3/uL (ref 4.0–10.5)
nRBC: 0 % (ref 0.0–0.2)

## 2021-08-20 LAB — BASIC METABOLIC PANEL
Anion gap: 8 (ref 5–15)
BUN: 13 mg/dL (ref 8–23)
CO2: 28 mmol/L (ref 22–32)
Calcium: 9 mg/dL (ref 8.9–10.3)
Chloride: 97 mmol/L — ABNORMAL LOW (ref 98–111)
Creatinine, Ser: 0.86 mg/dL (ref 0.44–1.00)
GFR, Estimated: >60 mL/min (ref >60)
Glucose, Bld: 138 mg/dL — ABNORMAL HIGH (ref 70–99)
Potassium: 3.5 mmol/L (ref 3.5–5.1)
Sodium: 133 mmol/L — ABNORMAL LOW (ref 135–145)

## 2021-08-20 LAB — GLUCOSE, CAPILLARY
Glucose-Capillary: 188 mg/dL — ABNORMAL HIGH (ref 70–99)
Glucose-Capillary: 143 mg/dL — ABNORMAL HIGH (ref 70–99)

## 2021-08-20 MED ORDER — ASPIRIN 325 MG PO TBEC: 325.0000 mg | DELAYED_RELEASE_TABLET | Freq: Every day | ORAL | 0 refills | Status: DC

## 2021-08-20 MED ORDER — OXYCODONE-ACETAMINOPHEN 5-325 MG PO TABS: 1.0000 | ORAL_TABLET | Freq: Four times a day (QID) | ORAL | 0 refills | Status: DC | PRN

## 2021-08-20 MED ORDER — METHOCARBAMOL 500 MG PO TABS: 500.0000 mg | ORAL_TABLET | Freq: Four times a day (QID) | ORAL | 0 refills | Status: DC | PRN

## 2021-08-20 NOTE — Discharge Instructions (Addendum)
INSTRUCTIONS AFTER JOINT REPLACEMENT  ? ?Remove items at home which could result in a fall. This includes throw rugs or furniture in walking pathways ?ICE to the affected joint every three hours while awake for 30 minutes at a time, for at least the first 3-5 days, and then as needed for pain and swelling.  Continue to use ice for pain and swelling. You may notice swelling that will progress down to the foot and ankle.  This is normal after surgery.  Elevate your leg when you are not up walking on it.   ?Continue to use the breathing machine you got in the hospital (incentive spirometer) which will help keep your temperature down.  It is common for your temperature to cycle up and down following surgery, especially at night when you are not up moving around and exerting yourself.  The breathing machine keeps your lungs expanded and your temperature down. ? ? ?DIET:  As you were doing prior to hospitalization, we recommend a well-balanced diet. ? ?DRESSING / WOUND CARE / SHOWER: He can leave the dressing on to come back to see Dr. Lorin Mercy on Thursday in the evening clinic.  If the dressing comes off you can apply the extra dressing Mepilex that was given to you.  The ? ?ACTIVITY ? ?Increase activity slowly as tolerated, but follow the weight bearing instructions below.   ?No driving for 6 weeks or until further direction given by your physician.  You cannot drive while taking narcotics.  ?No lifting or carrying greater than 10 lbs. until further directed by your surgeon. ?Avoid periods of inactivity such as sitting longer than an hour when not asleep. This helps prevent blood clots.  ?You may return to work once you are authorized by your doctor.  ? ? ? ?WEIGHT BEARING  ? ?Weight bearing as tolerated with assist device (walker, cane, etc) as directed, use it as long as suggested by your surgeon or therapist, typically at least 4-6 weeks. ? ? ?EXERCISES ? ?Results after joint replacement surgery are often greatly  improved when you follow the exercise, range of motion and muscle strengthening exercises prescribed by your doctor. Safety measures are also important to protect the joint from further injury. Any time any of these exercises cause you to have increased pain or swelling, decrease what you are doing until you are comfortable again and then slowly increase them. If you have problems or questions, call your caregiver or physical therapist for advice.  ? ?Rehabilitation is important following a joint replacement. After just a few days of immobilization, the muscles of the leg can become weakened and shrink (atrophy).  These exercises are designed to build up the tone and strength of the thigh and leg muscles and to improve motion. Often times heat used for twenty to thirty minutes before working out will loosen up your tissues and help with improving the range of motion but do not use heat for the first two weeks following surgery (sometimes heat can increase post-operative swelling).  ? ?These exercises can be done on a training (exercise) mat, on the floor, on a table or on a bed. Use whatever works the best and is most comfortable for you.    Use music or television while you are exercising so that the exercises are a pleasant break in your day. This will make your life better with the exercises acting as a break in your routine that you can look forward to.   Perform all exercises about fifteen  times, three times per day or as directed.  You should exercise both the operative leg and the other leg as well. ? ?Exercises include: ?  ?Quad Sets - Tighten up the muscle on the front of the thigh (Quad) and hold for 5-10 seconds.   ?Straight Leg Raises - With your knee straight (if you were given a brace, keep it on), lift the leg to 60 degrees, hold for 3 seconds, and slowly lower the leg.  Perform this exercise against resistance later as your leg gets stronger.  ?Leg Slides: Lying on your back, slowly slide your foot  toward your buttocks, bending your knee up off the floor (only go as far as is comfortable). Then slowly slide your foot back down until your leg is flat on the floor again.  ?Angel Wings: Lying on your back spread your legs to the side as far apart as you can without causing discomfort.  ?Hamstring Strength:  Lying on your back, push your heel against the floor with your leg straight by tightening up the muscles of your buttocks.  Repeat, but this time bend your knee to a comfortable angle, and push your heel against the floor.  You may put a pillow under the heel to make it more comfortable if necessary.  ? ?A rehabilitation program following joint replacement surgery can speed recovery and prevent re-injury in the future due to weakened muscles. Contact your doctor or a physical therapist for more information on knee rehabilitation.  ? ? ?CONSTIPATION ? ?Constipation is defined medically as fewer than three stools per week and severe constipation as less than one stool per week.  Even if you have a regular bowel pattern at home, your normal regimen is likely to be disrupted due to multiple reasons following surgery.  Combination of anesthesia, postoperative narcotics, change in appetite and fluid intake all can affect your bowels.  ? ?YOU MUST use at least one of the following options; they are listed in order of increasing strength to get the job done.  They are all available over the counter, and you may need to use some, POSSIBLY even all of these options:   ? ?Drink plenty of fluids (prune juice may be helpful) and high fiber foods ?Colace 100 mg by mouth twice a day  ?Senokot for constipation as directed and as needed Dulcolax (bisacodyl), take with full glass of water  ?Miralax (polyethylene glycol) once or twice a day as needed. ? ?If you have tried all these things and are unable to have a bowel movement in the first 3-4 days after surgery call either your surgeon or your primary doctor.   ? ?If you  experience loose stools or diarrhea, hold the medications until you stool forms back up.  If your symptoms do not get better within 1 week or if they get worse, check with your doctor.  If you experience "the worst abdominal pain ever" or develop nausea or vomiting, please contact the office immediately for further recommendations for treatment. ? ? ?ITCHING:  If you experience itching with your medications, try taking only a single pain pill, or even half a pain pill at a time.  You can also use Benadryl over the counter for itching or also to help with sleep.  ? ?TED HOSE STOCKINGS:  Use stockings on both legs until for at least 2 weeks or as directed by physician office. They may be removed at night for sleeping. ? ?MEDICATIONS:  See your medication summary on the ?  After Visit Summary? that nursing will review with you.  You may have some home medications which will be placed on hold until you complete the course of blood thinner medication.  It is important for you to complete the blood thinner medication as prescribed. ? ?PRECAUTIONS:  If you experience chest pain or shortness of breath - call 911 immediately for transfer to the hospital emergency department.  ? ?If you develop a fever greater that 101 F, purulent drainage from wound, increased redness or drainage from wound, foul odor from the wound/dressing, or calf pain - CONTACT YOUR SURGEON.   ?                                                ?FOLLOW-UP APPOINTMENTS:  If you do not already have a post-op appointment, please call the office for an appointment to be seen by your surgeon.  Guidelines for how soon to be seen are listed in your ?After Visit Summary?, but are typically between 1-4 weeks after surgery. ? ?OTHER INSTRUCTIONS:  ? ?Knee Replacement:  Do not place pillow under knee, focus on keeping the knee straight while resting. CPM instructions: 0-90 degrees, 2 hours in the morning, 2 hours in the afternoon, and 2 hours in the evening. Place foam  block, curve side up under heel at all times except when in CPM or when walking.  DO NOT modify, tear, cut, or change the foam block in any way. ? ?POST-OPERATIVE OPIOID TAPER INSTRUCTIONS: ?It is important to we

## 2021-08-20 NOTE — Progress Notes (Signed)
Physical Therapy Treatment ?Patient Details ?Name: Taylor Cohen ?MRN: [id number] ?DOB: [birth year] ?Today's Date: 08/20/2021 ? ? ?History of Present Illness Pt is a 66 y.o. female admitted 08/18/21 for elective L THA (direct anterior approach). PMH includes HTN, DM, asthma, breast CA. ?  ?PT Comments  ? ? Pt progressing well with mobility; able to ambulate 210' with RW, tolerated stair training well. Pt declines additional PT session this afternoon, hopeful for d/c home soon (RN notified). Reviewed education, pt reports no further questions or concerns. If to remain admitted, will continue to follow acutely. ?   ?Recommendations for follow up therapy are one component of a multi-disciplinary discharge planning process, led by the attending physician.  Recommendations may be updated based on patient status, additional functional criteria and insurance authorization. ? ?Follow Up Recommendations ? Follow physician's recommendations for discharge plan and follow up therapies ?  ?  ?Assistance Recommended at Discharge Intermittent Supervision/Assistance  ?Patient can return home with the following Assistance with cooking/housework;Assist for transportation;Help with stairs or ramp for entrance;A little help with walking and/or transfers;A little help with bathing/dressing/bathroom ?  ?Equipment Recommendations ? Rolling walker (2 wheels);BSC/3in1 (delivered)  ?  ?Recommendations for Other Services   ? ? ?  ?Precautions / Restrictions Precautions ?Precautions: Fall ?Restrictions ?Weight Bearing Restrictions: Yes ?LLE Weight Bearing: Weight bearing as tolerated  ?  ? ?Mobility ? Bed Mobility ?Overal bed mobility: Modified Independent ?Bed Mobility: Sit to Supine ?  ?  ?  ?  ?  ?General bed mobility comments: able to return to supine with bed flat and use of gait belt to assist LLE into bed ?  ? ?Transfers ?Overall transfer level: Modified independent ?Equipment used: Rolling walker (2 wheels) ?Transfers: Sit to/from  Stand ?  ?  ?  ?  ?  ?  ?General transfer comment: mod indep for sit<>stand from EOB and BSC (over toilet) ?  ? ?Ambulation/Gait ?Ambulation/Gait assistance: Supervision ?Gait Distance (Feet): 210 Feet ?Assistive device: Rolling walker (2 wheels) ?Gait Pattern/deviations: Step-through pattern, Decreased stride length, Trunk flexed, Antalgic ?Gait velocity: Decreased ?  ?  ?General Gait Details: Slow, antalgic gait with RW and supervision for safety; able to minimally increase gait speed with cues, preference for slowed gait ? ? ?Stairs ?Stairs: Yes ?Stairs assistance: Min guard ?Stair Management: One rail Right, Step to pattern, Forwards ?Number of Stairs: 4 ?General stair comments: Ascend/descend 4 steps with single rail support and HHA, min guard for balance; instructed pt on how to have son guard and provide HHA for balance ? ? ?Wheelchair Mobility ?  ? ?Modified Rankin (Stroke Patients Only) ?  ? ? ?  ?Balance Overall balance assessment: Needs assistance ?Sitting-balance support: No upper extremity supported, Feet supported ?Sitting balance-Leahy Scale: Good ?  ?  ?  ?Standing balance-Leahy Scale: Fair ?Standing balance comment: can static stand at sink to wash hands without UE support; static and dynamic stability improved with RW ?  ?  ?  ?  ?  ?  ?  ?  ?  ?  ?  ?  ? ?  ?Cognition Arousal/Alertness: Awake/alert ?Behavior During Therapy: [facility] for tasks assessed/performed ?Overall Cognitive Status: Within Functional Limits for tasks assessed ?  ?  ?  ?  ?  ?  ?  ?  ?  ?  ?  ?  ?  ?  ?  ?  ?  ?  ?  ? ?  ?Exercises   ? ?  ?General Comments General comments (  skin integrity, edema, etc.): pt preparing for d/c home, declines a second PT session this afternoon. reviewed education, pt reports no further questions or concerns ?  ?  ? ?Pertinent Vitals/Pain Pain Assessment ?Pain Assessment: Faces ?Faces Pain Scale: Hurts little more ?Pain Location: L hip ?Pain Descriptors / Indicators: Discomfort, Grimacing, Operative  site guarding, Moaning ?Pain Intervention(s): Monitored during session, Limited activity within patient's tolerance  ? ? ?Home Living   ?  ?  ?  ?  ?  ?  ?  ?  ?  ?   ?  ?Prior Function    ?  ?  ?   ? ?PT Goals (current goals can now be found in the care plan section) Progress towards PT goals: Progressing toward goals ? ?  ?Frequency ? ? ? 7X/week ? ? ? ?  ?PT Plan Current plan remains appropriate  ? ? ?Co-evaluation   ?  ?  ?  ?  ? ?  ?AM-PAC PT "6 Clicks" Mobility   ?Outcome Measure ? Help needed turning from your back to your side while in a flat bed without using bedrails?: None ?Help needed moving from lying on your back to sitting on the side of a flat bed without using bedrails?: None ?Help needed moving to and from a bed to a chair (including a wheelchair)?: None ?Help needed standing up from a chair using your arms (e.g., wheelchair or bedside chair)?: None ?Help needed to walk in hospital room?: A Little ?Help needed climbing 3-5 steps with a railing? : A Little ?6 Click Score: 22 ? ?  ?End of Session Equipment Utilized During Treatment: Gait belt ?Activity Tolerance: Patient tolerated treatment well ?Patient left: in bed;with call bell/phone within reach ?Nurse Communication: Mobility status ?PT Visit Diagnosis: Other abnormalities of gait and mobility (R26.89);Pain ?Pain - Right/Left: Left ?Pain - part of body: Hip ?  ? ? ?Time: 3267-1245 ?PT Time Calculation (min) (ACUTE ONLY): 26 min ? ?Charges:  $Gait Training: 8-22 mins ?$Self Care/Home Management: 8-22          ?          ? ?Mabeline Caras, PT, DPT ?Acute Rehabilitation Services  ?Pager [phone] ?Office [phone] ? ?[name] ?08/20/2021, 1:12 PM ? ?

## 2021-08-23 ENCOUNTER — Telehealth: Payer: Self-pay | Admitting: *Deleted

## 2021-08-23 NOTE — Telephone Encounter (Signed)
Ortho bundle D/C call completed. 

## 2021-08-24 ENCOUNTER — Encounter: Payer: Self-pay | Admitting: Internal Medicine

## 2021-08-24 NOTE — Discharge Summary (Signed)
? ?Patient ID: ?Taylor Cohen ?MRN: [id number] ?DOB/AGE: 01/06/1956 66 y.o. ? ?Admit date: 08/18/2021 ?Discharge date: 08/20/2021 ? ?Admission Diagnoses:  ?Principal Problem: ?  Arthritis of left hip ? ? ?Discharge Diagnoses:  ?Principal Problem: ?  Arthritis of left hip ? status post Procedure(s): ?Left TOTAL HIP ARTHROPLASTY ANTERIOR APPROACH ? ?Past Medical History:  ?Diagnosis Date  ? Asthma   ? Breast cancer ([place])   ? Around 2004-2005. Left; s/p mastectomy and chemo  ? Diabetes ([place])   ? Dyspnea   ? GERD (gastroesophageal reflux disease)   ? History of kidney stones   ? HLD (hyperlipidemia)   ? HTN (hypertension)   ? ? ?Surgeries: Procedure(s): ?Left TOTAL HIP ARTHROPLASTY ANTERIOR APPROACH on 08/18/2021 ?  ?Consultants:  ? ?Discharged Condition: Improved ? ?Hospital Course: Taylor Cohen is an 66 y.o. female who was admitted 08/18/2021 for operative treatment of Arthritis of left hip. Patient failed conservative treatments (please see the history and physical for the specifics) and had severe unremitting pain that affects sleep, daily activities and work/hobbies. After pre-op clearance, the patient was taken to the operating room on 08/18/2021 and underwent  Procedure(s): ?Left [place].   ? ?Patient was given perioperative antibiotics:  ?Anti-infectives (From admission, onward)  ? ? Start     Dose/Rate Route Frequency Ordered Stop  ? 08/18/21 1032  ceFAZolin (ANCEF) 2-4 GM/100ML-% IVPB       ?Note to Pharmacy: Odelia Gage E: cabinet override  ?    08/18/21 1032 08/18/21 1312  ? 08/18/21 1030  ceFAZolin (ANCEF) IVPB 2g/100 mL premix       ? 2 g ?200 mL/hr over 30 Minutes Intravenous On call to O.R. 08/18/21 1020 08/18/21 1312  ? ?  ?  ? ?Patient was given sequential compression devices and early ambulation to prevent DVT.  ? ?Patient benefited maximally from hospital stay and there were no complications. At the time of discharge, the patient was urinating/moving their  bowels without difficulty, tolerating a regular diet, pain is controlled with oral pain medications and they have been cleared by PT/OT.  ? ?Recent vital signs: No data found.  ? ?Recent laboratory studies: No results for input(s): WBC, HGB, HCT, PLT, NA, K, CL, CO2, BUN, CREATININE, GLUCOSE, INR, CALCIUM in the last 72 hours. ? ?Invalid input(s): PT, 2 ? ? ?Discharge Medications:   ?Allergies as of 08/20/2021   ? ?   Reactions  ? Lisinopril Swelling  ? ?  ? ?  ?Medication List  ?  ? ?STOP taking these medications   ? ?acetaminophen 500 MG tablet ?Commonly known as: TYLENOL ?  ?ibuprofen 200 MG tablet ?Commonly known as: ADVIL ?  ? ?  ? ?TAKE these medications   ? ?albuterol (2.5 MG/3ML) 0.083% nebulizer solution ?Commonly known as: PROVENTIL ?Take 2.5 mg by nebulization every 6 (six) hours as needed for wheezing or shortness of breath. ?  ?albuterol 108 (90 Base) MCG/ACT inhaler ?Commonly known as: VENTOLIN HFA ?Inhale 1-2 puffs into the lungs every 6 (six) hours as needed for wheezing or shortness of breath. ?  ?amLODipine 10 MG tablet ?Commonly known as: NORVASC ?Take 10 mg by mouth in the morning. ?  ?aspirin 325 MG EC tablet ?Take 1 tablet (325 mg total) by mouth daily with breakfast. MUST TAKE AT LEAST 4 WEEKS POSTOP FOR DVT PROPHYLAXIS ?  ?benzonatate 100 MG capsule ?Commonly known as: TESSALON ?Take 1 capsule (100 mg total) by mouth every 8 (eight)  hours. ?  ?CALCIUM 600+D3 PO ?Take 1 tablet by mouth in the morning. ?  ?citalopram 20 MG tablet ?Commonly known as: CELEXA ?Take 20 mg by mouth in the morning. ?  ?cycloSPORINE 0.05 % ophthalmic emulsion ?Commonly known as: RESTASIS ?Place 1 drop into both eyes 2 (two) times daily as needed (dry/irritated eyes). ?  ?Farxiga 10 MG Tabs tablet ?Generic drug: dapagliflozin propanediol ?Take 10 mg by mouth every morning. ?  ?glipiZIDE 5 MG 24 hr tablet ?Commonly known as: GLUCOTROL XL ?Take 5 mg by mouth daily. ?  ?hydrochlorothiazide 25 MG tablet ?Commonly known as:  HYDRODIURIL ?Take 25 mg by mouth in the morning. ?  ?metFORMIN 1000 MG tablet ?Commonly known as: GLUCOPHAGE ?Take 1,000 mg by mouth every morning. ?  ?methocarbamol 500 MG tablet ?Commonly known as: ROBAXIN ?Take 1 tablet (500 mg total) by mouth every 6 (six) hours as needed for muscle spasms. ?  ?multivitamin tablet ?Take 1 tablet by mouth daily. ?  ?omega-3 acid ethyl esters 1 g capsule ?Commonly known as: LOVAZA ?Take 1 capsule by mouth 2 (two) times daily. ?  ?oxyCODONE-acetaminophen 5-325 MG tablet ?Commonly known as: PERCOCET/ROXICET ?Take 1-2 tablets by mouth every 6 (six) hours as needed for severe pain. ?  ?pravastatin 40 MG tablet ?Commonly known as: PRAVACHOL ?Take 40 mg by mouth daily. ?  ? ?  ? ? ?Diagnostic Studies: DG Chest 2 View ? ?Result Date: 08/13/2021 ?CLINICAL DATA:  Preop testing. EXAM: CHEST - 2 VIEW COMPARISON:  Chest XR, 04/07/2021. FINDINGS: Cardiomediastinal silhouette is within normal limits. Lungs are well inflated. No focal consolidation or mass. No pleural effusion or pneumothorax. LEFT axillary surgical clips. No acute displaced fracture. IMPRESSION: Normal chest Electronically Signed   By: Michaelle Birks M.D.   On: 08/13/2021 08:52  ? ?DG C-Arm 1-60 Min-No Report ? ?Result Date: 08/18/2021 ?Fluoroscopy was utilized by the requesting physician.  No radiographic interpretation.  ? ?DG C-Arm 1-60 Min-No Report ? ?Result Date: 08/18/2021 ?Fluoroscopy was utilized by the requesting physician.  No radiographic interpretation.  ? ?DG HIP UNILAT WITH PELVIS 1V LEFT ? ?Result Date: 08/18/2021 ?CLINICAL DATA:  Left total hip arthroplasty. EXAM: DG HIP (WITH OR WITHOUT PELVIS) 1V*L* COMPARISON:  Left hip radiographs 06/24/2021 FLUOROSCOPY: Fluoroscopy Time: 20 seconds Radiation Exposure Index: 2.36 mGy FINDINGS: Four intraoperative fluoroscopic images are provided during left total hip arthroplasty. No acute complication is evident on these limited images. IMPRESSION: Intraoperative images during  left total hip arthroplasty. Electronically Signed   By: Logan Bores M.D.   On: 08/18/2021 14:29  ? ?DG Hip Port Unilat With Pelvis 1V Left ? ?Result Date: 08/18/2021 ?CLINICAL DATA:  LEFT total hip arthroplasty EXAM: DG HIP (WITH OR WITHOUT PELVIS) 1V PORT LEFT COMPARISON:  None. FINDINGS: Total hip arthroplasty. Prosthetic components appear well seated. No fracture. Expected postsurgical soft tissue changes. IMPRESSION: Total hip arthroplasty without complication. Electronically Signed   By: Suzy Bouchard M.D.   On: 08/18/2021 15:41   ? ? ? ? Follow-up Information   ? ? Marybelle Killings, MD. Schedule an appointment as soon as possible for a visit today.   ?Specialty: Orthopedic Surgery ?Why: need return office visit with Dr Lorin Mercy in 2  week postop on a Thursday [facility] ?Contact information: ?[address] ?[place] [address] ?[phone] ? ? ?  ?  ? ?  ?  ? ?  ? ? ?Discharge Plan:  discharge to home ? ?Disposition:  ? ? ? ?Signed: ?Benjiman Core  ?  08/24/2021, 3:13 PM ? ? ? ?  ?

## 2021-08-26 ENCOUNTER — Ambulatory Visit (INDEPENDENT_AMBULATORY_CARE_PROVIDER_SITE_OTHER): Payer: Medicare Other | Admitting: Surgery

## 2021-08-26 ENCOUNTER — Telehealth: Payer: Self-pay | Admitting: *Deleted

## 2021-08-26 ENCOUNTER — Encounter: Payer: Self-pay | Admitting: Surgery

## 2021-08-26 ENCOUNTER — Ambulatory Visit: Payer: Medicare Other

## 2021-08-26 VITALS — BP 100/56 | HR 81

## 2021-08-26 DIAGNOSIS — Z96642 Presence of left artificial hip joint: Secondary | ICD-10-CM

## 2021-08-26 NOTE — Telephone Encounter (Signed)
Ortho bundle 7 day call completed. 

## 2021-08-26 NOTE — Progress Notes (Signed)
? ?  Post-Op Visit Note ?  ?Patient: Taylor Cohen           ?Date of Birth: 1955-09-02           ?MRN: [id number] ?Visit Date: 08/26/2021 ?PCP: Neale Burly, MD ? ? ?Assessment & Plan: ? ?Chief Complaint:  ?Chief Complaint  ?Patient presents with  ? Left Hip - Pain, Routine Post Op  ?66 year old white female who is status post left total hip replacement March 29 returns.  States that she is doing well.  Does not need refill of pain medication. ?Visit Diagnoses:  ?1. S/P total left hip arthroplasty   ? ? ?Plan: I will have patient follow-up with Dr. Lorin Mercy in 1 week in the [facility] clinic for wound check and possible left hip staple removal.  Gradually increase walking distances. ?Follow-Up Instructions: Return in about 1 week (around 09/02/2021) for With Dr. Lorin Mercy in the [facility] clinic for left hip wound check and possible staple removal.  ? ?Orders:  ?Orders Placed This Encounter  ?Procedures  ? XR HIP UNILAT W OR W/O PELVIS 2-3 VIEWS LEFT  ? ?No orders of the defined types were placed in this encounter. ? ? ?Imaging: ?No results found. ? ?PMFS History: ?Patient Active Problem List  ? Diagnosis Date Noted  ? Arthritis of left hip 08/18/2021  ? Unilateral primary osteoarthritis, left hip 07/01/2021  ? Dysphagia 02/15/2021  ? ?Past Medical History:  ?Diagnosis Date  ? Asthma   ? Breast cancer ([place])   ? Around 2004-2005. Left; s/p mastectomy and chemo  ? Diabetes ([place])   ? Dyspnea   ? GERD (gastroesophageal reflux disease)   ? History of kidney stones   ? HLD (hyperlipidemia)   ? HTN (hypertension)   ?  ?Family History  ?Problem Relation Age of Onset  ? Colon cancer Neg Hx   ? Stomach cancer Neg Hx   ? Esophageal cancer Neg Hx   ?  ?Past Surgical History:  ?Procedure Laterality Date  ? BALLOON DILATION N/A 03/02/2021  ? Procedure: BALLOON DILATION;  Surgeon: Eloise Harman, DO;  [facility];  Service: Endoscopy;  Laterality: N/A;  ? BIOPSY  03/02/2021  ? Procedure: BIOPSY;  Surgeon: Eloise Harman,  DO;  [facility];  Service: Endoscopy;;  ? BREAST SURGERY    ? left mastectomy  ? COLONOSCOPY    ? [place]  ? ESOPHAGOGASTRODUODENOSCOPY (EGD) WITH PROPOFOL N/A 03/02/2021  ? Procedure: ESOPHAGOGASTRODUODENOSCOPY (EGD) WITH PROPOFOL;  Surgeon: Eloise Harman, DO;  [facility];  Service: Endoscopy;  Laterality: N/A;  10:00am  ? MASTECTOMY Left   ? 2004 or 2005  ? TOTAL HIP ARTHROPLASTY Left 08/18/2021  ? Procedure: Left TOTAL HIP ARTHROPLASTY ANTERIOR APPROACH;  Surgeon: Marybelle Killings, MD;  Location: [place];  Service: Orthopedics;  Laterality: Left;  ? TUBAL LIGATION    ? ?Social History  ? ?Occupational History  ? Not on file  ?Tobacco Use  ? Smoking status: Never  ? Smokeless tobacco: Never  ?Substance and Sexual Activity  ? Alcohol use: Never  ? Drug use: Never  ? Sexual activity: Not on file  ? ? ?Exam ?Pleasant female alert and oriented in no acute distress.  Wound looks good.  Staples intact.  No drainage or signs of infection. ?

## 2021-09-02 ENCOUNTER — Encounter: Payer: Self-pay | Admitting: Orthopaedic Surgery

## 2021-09-02 ENCOUNTER — Telehealth: Payer: Self-pay | Admitting: *Deleted

## 2021-09-02 ENCOUNTER — Ambulatory Visit (INDEPENDENT_AMBULATORY_CARE_PROVIDER_SITE_OTHER): Payer: Medicare Other | Admitting: Orthopaedic Surgery

## 2021-09-02 DIAGNOSIS — Z96642 Presence of left artificial hip joint: Secondary | ICD-10-CM | POA: Insufficient documentation

## 2021-09-02 NOTE — Telephone Encounter (Signed)
Ortho bundle 14 day call completed. 

## 2021-09-02 NOTE — Progress Notes (Signed)
? ?  Post-Op Visit Note ?  ?Patient: Taylor Cohen           ?Date of Birth: 1955-09-04           ?MRN: [id number] ?Visit Date: 09/02/2021 ?PCP: Neale Burly, MD ? ? ?Assessment & Plan: Post left total hip arthroplasty direct anterior approach Staples harvested today.  She is having some swelling and discomfort in her knee likely from the stem.  We reviewed x-rays leg lengths are equal.  Continue walking with her cane she still has some pain medication she will call if she needs more.  Some days she is not taking any pain pills.  Recheck 5 weeks. ? ?Chief Complaint:  ?Chief Complaint  ?Patient presents with  ? Right Hip - Follow-up  ?  08/18/2021 left THA  ? ?Visit Diagnoses:  ?1. S/P total left hip arthroplasty   ? ? ?Plan: Return 5 weeks. ? ?Follow-Up Instructions: Return in about 5 weeks (around 10/07/2021).  ? ?Orders:  ?No orders of the defined types were placed in this encounter. ? ?No orders of the defined types were placed in this encounter. ? ? ?Imaging: ?No results found. ? ?PMFS History: ?Patient Active Problem List  ? Diagnosis Date Noted  ? S/P total left hip arthroplasty 09/02/2021  ? Dysphagia 02/15/2021  ? ?Past Medical History:  ?Diagnosis Date  ? Asthma   ? Breast cancer ([place])   ? Around 2004-2005. Left; s/p mastectomy and chemo  ? Diabetes ([place])   ? Dyspnea   ? GERD (gastroesophageal reflux disease)   ? History of kidney stones   ? HLD (hyperlipidemia)   ? HTN (hypertension)   ?  ?Family History  ?Problem Relation Age of Onset  ? Colon cancer Neg Hx   ? Stomach cancer Neg Hx   ? Esophageal cancer Neg Hx   ?  ?Past Surgical History:  ?Procedure Laterality Date  ? BALLOON DILATION N/A 03/02/2021  ? Procedure: BALLOON DILATION;  Surgeon: Eloise Harman, DO;  [facility];  Service: Endoscopy;  Laterality: N/A;  ? BIOPSY  03/02/2021  ? Procedure: BIOPSY;  Surgeon: Eloise Harman, DO;  [facility];  Service: Endoscopy;;  ? BREAST SURGERY    ? left mastectomy  ?  COLONOSCOPY    ? [place]  ? ESOPHAGOGASTRODUODENOSCOPY (EGD) WITH PROPOFOL N/A 03/02/2021  ? Procedure: ESOPHAGOGASTRODUODENOSCOPY (EGD) WITH PROPOFOL;  Surgeon: Eloise Harman, DO;  [facility];  Service: Endoscopy;  Laterality: N/A;  10:00am  ? MASTECTOMY Left   ? 2004 or 2005  ? TOTAL HIP ARTHROPLASTY Left 08/18/2021  ? Procedure: Left TOTAL HIP ARTHROPLASTY ANTERIOR APPROACH;  Surgeon: Marybelle Killings, MD;  Location: [place];  Service: Orthopedics;  Laterality: Left;  ? TUBAL LIGATION    ? ?Social History  ? ?Occupational History  ? Not on file  ?Tobacco Use  ? Smoking status: Never  ? Smokeless tobacco: Never  ?Substance and Sexual Activity  ? Alcohol use: Never  ? Drug use: Never  ? Sexual activity: Not on file  ? ? ? ?

## 2021-09-27 DIAGNOSIS — J4541 Moderate persistent asthma with (acute) exacerbation: Secondary | ICD-10-CM | POA: Diagnosis not present

## 2021-09-27 DIAGNOSIS — I10 Essential (primary) hypertension: Secondary | ICD-10-CM | POA: Diagnosis not present

## 2021-09-27 DIAGNOSIS — M160 Bilateral primary osteoarthritis of hip: Secondary | ICD-10-CM | POA: Diagnosis not present

## 2021-09-27 DIAGNOSIS — M25552 Pain in left hip: Secondary | ICD-10-CM | POA: Diagnosis not present

## 2021-09-27 DIAGNOSIS — Z0000 Encounter for general adult medical examination without abnormal findings: Secondary | ICD-10-CM | POA: Diagnosis not present

## 2021-09-27 DIAGNOSIS — E119 Type 2 diabetes mellitus without complications: Secondary | ICD-10-CM | POA: Diagnosis not present

## 2021-10-05 DIAGNOSIS — R262 Difficulty in walking, not elsewhere classified: Secondary | ICD-10-CM | POA: Diagnosis not present

## 2021-10-05 DIAGNOSIS — M25552 Pain in left hip: Secondary | ICD-10-CM | POA: Diagnosis not present

## 2021-10-07 ENCOUNTER — Ambulatory Visit (INDEPENDENT_AMBULATORY_CARE_PROVIDER_SITE_OTHER): Payer: Medicare Other | Admitting: Orthopaedic Surgery

## 2021-10-07 ENCOUNTER — Ambulatory Visit: Payer: Self-pay

## 2021-10-07 ENCOUNTER — Encounter: Payer: Self-pay | Admitting: Orthopaedic Surgery

## 2021-10-07 VITALS — Ht 64.0 in | Wt 171.0 lb

## 2021-10-07 DIAGNOSIS — M25552 Pain in left hip: Secondary | ICD-10-CM

## 2021-10-07 NOTE — Progress Notes (Signed)
   Post-Op Visit Note   Patient: Taylor Cohen           Date of Birth: 1956/02/14           MRN: [id number] Visit Date: 10/07/2021 PCP: Neale Burly, MD   Assessment & Plan: 6 weeks post total hip arthroplasty left.  She still using a walker doing outpatient therapy still has difficulty with some quad weakness.  I can, quad with 2-3 fingers.  She is able to stand on her left leg without pain.  X-rays look good.  Patient's taking 1 step and then bringing her left leg up.  She needs to progress to a cane increase her walking and also work on quad strengthening at home I gave her additional exercises.  Recheck 5 weeks.  Chief Complaint:  Chief Complaint  Patient presents with   Left Hip - Follow-up    08/18/2021 Left THA   Visit Diagnoses:  1. Pain in left hip     Plan: Return 5 weeks.  Hip incision looks good she just needs more steps and more quad strengthening and more activity.  Follow-Up Instructions: No follow-ups on file.   Orders:  Orders Placed This Encounter  Procedures   XR HIP UNILAT W OR W/O PELVIS 2-3 VIEWS LEFT   No orders of the defined types were placed in this encounter.   Imaging: No results found.  PMFS History: Patient Active Problem List   Diagnosis Date Noted   S/P total left hip arthroplasty 09/02/2021   Dysphagia 02/15/2021   Past Medical History:  Diagnosis Date   Asthma    Breast cancer ([place])    Around 2004-2005. Left; s/p mastectomy and chemo   Diabetes ([place])    Dyspnea    GERD (gastroesophageal reflux disease)    History of kidney stones    HLD (hyperlipidemia)    HTN (hypertension)     Family History  Problem Relation Age of Onset   Colon cancer Neg Hx    Stomach cancer Neg Hx    Esophageal cancer Neg Hx     Past Surgical History:  Procedure Laterality Date   BALLOON DILATION N/A 03/02/2021   Procedure: BALLOON DILATION;  Surgeon: Eloise Harman, DO;  [facility];  Service: Endoscopy;  Laterality: N/A;    BIOPSY  03/02/2021   Procedure: BIOPSY;  Surgeon: Eloise Harman, DO;  [facility];  Service: Endoscopy;;   BREAST SURGERY     left mastectomy   COLONOSCOPY     [place]   ESOPHAGOGASTRODUODENOSCOPY (EGD) WITH PROPOFOL N/A 03/02/2021   Procedure: ESOPHAGOGASTRODUODENOSCOPY (EGD) WITH PROPOFOL;  Surgeon: Eloise Harman, DO;  [facility];  Service: Endoscopy;  Laterality: N/A;  10:00am   MASTECTOMY Left    2004 or 2005   TOTAL HIP ARTHROPLASTY Left 08/18/2021   Procedure: Left TOTAL HIP ARTHROPLASTY ANTERIOR APPROACH;  Surgeon: Marybelle Killings, MD;  Location: [place];  Service: Orthopedics;  Laterality: Left;   TUBAL LIGATION     Social History   Occupational History   Not on file  Tobacco Use   Smoking status: Never   Smokeless tobacco: Never  Substance and Sexual Activity   Alcohol use: Never   Drug use: Never   Sexual activity: Not on file

## 2021-10-11 DIAGNOSIS — M25552 Pain in left hip: Secondary | ICD-10-CM | POA: Diagnosis not present

## 2021-10-11 DIAGNOSIS — R262 Difficulty in walking, not elsewhere classified: Secondary | ICD-10-CM | POA: Diagnosis not present

## 2021-10-13 DIAGNOSIS — M25552 Pain in left hip: Secondary | ICD-10-CM | POA: Diagnosis not present

## 2021-10-13 DIAGNOSIS — R262 Difficulty in walking, not elsewhere classified: Secondary | ICD-10-CM | POA: Diagnosis not present

## 2021-10-19 DIAGNOSIS — M25552 Pain in left hip: Secondary | ICD-10-CM | POA: Diagnosis not present

## 2021-10-19 DIAGNOSIS — R262 Difficulty in walking, not elsewhere classified: Secondary | ICD-10-CM | POA: Diagnosis not present

## 2021-10-21 DIAGNOSIS — M25552 Pain in left hip: Secondary | ICD-10-CM | POA: Diagnosis not present

## 2021-10-21 DIAGNOSIS — R262 Difficulty in walking, not elsewhere classified: Secondary | ICD-10-CM | POA: Diagnosis not present

## 2021-10-25 DIAGNOSIS — M25552 Pain in left hip: Secondary | ICD-10-CM | POA: Diagnosis not present

## 2021-10-25 DIAGNOSIS — R262 Difficulty in walking, not elsewhere classified: Secondary | ICD-10-CM | POA: Diagnosis not present

## 2021-10-27 DIAGNOSIS — R262 Difficulty in walking, not elsewhere classified: Secondary | ICD-10-CM | POA: Diagnosis not present

## 2021-10-27 DIAGNOSIS — M25552 Pain in left hip: Secondary | ICD-10-CM | POA: Diagnosis not present

## 2021-11-01 ENCOUNTER — Ambulatory Visit: Payer: Medicaid Other | Admitting: Gastroenterology

## 2021-11-02 DIAGNOSIS — R262 Difficulty in walking, not elsewhere classified: Secondary | ICD-10-CM | POA: Diagnosis not present

## 2021-11-02 DIAGNOSIS — M25552 Pain in left hip: Secondary | ICD-10-CM | POA: Diagnosis not present

## 2021-11-04 DIAGNOSIS — R262 Difficulty in walking, not elsewhere classified: Secondary | ICD-10-CM | POA: Diagnosis not present

## 2021-11-04 DIAGNOSIS — M25552 Pain in left hip: Secondary | ICD-10-CM | POA: Diagnosis not present

## 2021-11-09 DIAGNOSIS — R262 Difficulty in walking, not elsewhere classified: Secondary | ICD-10-CM | POA: Diagnosis not present

## 2021-11-09 DIAGNOSIS — M25552 Pain in left hip: Secondary | ICD-10-CM | POA: Diagnosis not present

## 2021-11-11 ENCOUNTER — Ambulatory Visit (INDEPENDENT_AMBULATORY_CARE_PROVIDER_SITE_OTHER): Payer: Medicare Other | Admitting: Orthopaedic Surgery

## 2021-11-11 DIAGNOSIS — Z96642 Presence of left artificial hip joint: Secondary | ICD-10-CM

## 2021-11-11 NOTE — Progress Notes (Signed)
   Post-Op Visit Note   Patient: Taylor Cohen           Date of Birth: [birth year]           MRN: [id number] Visit Date: 11/11/2021 PCP: Neale Burly, MD   Assessment & Plan: Follow-up left total hip arthroplasty almost 3 months now.  Right she still in therapy still has left quad weakness.  Offset on x-rays good leg lengths look good.  I can overcome her quad with 1 finger pressure.  We discussed additional exercises to do at home.  She can walk down the hall holding her cane but still has a slight weak quad gait.  Continue strengthening recheck 8 weeks. Postop  Chief Complaint:  Chief Complaint  Patient presents with   Left Hip - Routine Post Op   Visit Diagnoses: No diagnosis found.  Plan: Return in 8 weeks if she still having some limping we will repeat [facility] pelvis to show her left hip and also frog-leg left hip on return.  Follow-Up Instructions: No follow-ups on file.   Orders:  No orders of the defined types were placed in this encounter.  No orders of the defined types were placed in this encounter.   Imaging: No results found.  PMFS History: Patient Active Problem List   Diagnosis Date Noted   S/P total left hip arthroplasty 09/02/2021   Dysphagia 02/15/2021   Past Medical History:  Diagnosis Date   Asthma    Breast cancer ([place])    Around 2004-2005. Left; s/p mastectomy and chemo   Diabetes ([place])    Dyspnea    GERD (gastroesophageal reflux disease)    History of kidney stones    HLD (hyperlipidemia)    HTN (hypertension)     Family History  Problem Relation Age of Onset   Colon cancer Neg Hx    Stomach cancer Neg Hx    Esophageal cancer Neg Hx     Past Surgical History:  Procedure Laterality Date   BALLOON DILATION N/A 03/02/2021   Procedure: BALLOON DILATION;  Surgeon: Eloise Harman, DO;  [facility];  Service: Endoscopy;  Laterality: N/A;   BIOPSY  03/02/2021   Procedure: BIOPSY;  Surgeon: Eloise Harman, DO;  [facility];  Service: Endoscopy;;   BREAST SURGERY     left mastectomy   COLONOSCOPY     [place]   ESOPHAGOGASTRODUODENOSCOPY (EGD) WITH PROPOFOL N/A 03/02/2021   Procedure: ESOPHAGOGASTRODUODENOSCOPY (EGD) WITH PROPOFOL;  Surgeon: Eloise Harman, DO;  [facility];  Service: Endoscopy;  Laterality: N/A;  10:00am   MASTECTOMY Left    2004 or 2005   TOTAL HIP ARTHROPLASTY Left 08/18/2021   Procedure: Left TOTAL HIP ARTHROPLASTY ANTERIOR APPROACH;  Surgeon: Marybelle Killings, MD;  Location: [place];  Service: Orthopedics;  Laterality: Left;   TUBAL LIGATION     Social History   Occupational History   Not on file  Tobacco Use   Smoking status: Never   Smokeless tobacco: Never  Substance and Sexual Activity   Alcohol use: Never   Drug use: Never   Sexual activity: Not on file

## 2021-11-12 DIAGNOSIS — R262 Difficulty in walking, not elsewhere classified: Secondary | ICD-10-CM | POA: Diagnosis not present

## 2021-11-12 DIAGNOSIS — M25552 Pain in left hip: Secondary | ICD-10-CM | POA: Diagnosis not present

## 2021-11-15 NOTE — Progress Notes (Deleted)
Referring Provider: Neale Burly, MD Primary Care Physician:  Neale Burly, MD Primary GI Physician: Dr. Abbey Chatters  No chief complaint on file.   HPI:   Taylor Cohen is a 66 y.o. female  presenting today for follow-up of solid food and pill dysphagia s/p EGD completed 03/02/2021 revealing small hiatal hernia, grade C reflux esophagitis, mild Schatzki's ring dilated, gastritis biopsied.  Recommended omeprazole 20 mg twice daily.  Her pathology came back with gastric biopsy positive for H. pylori, esophageal biopsy consistent with reflux changes.  Recommended treatment with Prevpac (Rx sent on 10/14), hold current PPI until Prevpac completed, then resume PPI therapy.   Patient was seen in the emergency room on 03/15/2021 for angioedema.  Suspected the culprit was lisinopril, but antibiotics for H. pylori were also discontinued.  Today:  H pylori  Dysphagia:  GERD:    Prior colonoscopy at [facility] without polyps per patient.   Past Medical History:  Diagnosis Date   Asthma    Breast cancer ([place])    Around 2004-2005. Left; s/p mastectomy and chemo   Diabetes ([place])    Dyspnea    GERD (gastroesophageal reflux disease)    History of kidney stones    HLD (hyperlipidemia)    HTN (hypertension)     Past Surgical History:  Procedure Laterality Date   BALLOON DILATION N/A 03/02/2021   Procedure: BALLOON DILATION;  Surgeon: Eloise Harman, DO;  [facility];  Service: Endoscopy;  Laterality: N/A;   BIOPSY  03/02/2021   Procedure: BIOPSY;  Surgeon: Eloise Harman, DO;  [facility];  Service: Endoscopy;;   BREAST SURGERY     left mastectomy   COLONOSCOPY     [place]   ESOPHAGOGASTRODUODENOSCOPY (EGD) WITH PROPOFOL N/A 03/02/2021   Procedure: ESOPHAGOGASTRODUODENOSCOPY (EGD) WITH PROPOFOL;  Surgeon: Eloise Harman, DO;  [facility];  Service: Endoscopy;  Laterality: N/A;  10:00am   MASTECTOMY Left    2004 or 2005    TOTAL HIP ARTHROPLASTY Left 08/18/2021   Procedure: Left TOTAL HIP ARTHROPLASTY ANTERIOR APPROACH;  Surgeon: Marybelle Killings, MD;  Location: [place];  Service: Orthopedics;  Laterality: Left;   TUBAL LIGATION      Current Outpatient Medications  Medication Sig Dispense Refill   albuterol (PROVENTIL) (2.5 MG/3ML) 0.083% nebulizer solution Take 2.5 mg by nebulization every 6 (six) hours as needed for wheezing or shortness of breath.     albuterol (VENTOLIN HFA) 108 (90 Base) MCG/ACT inhaler Inhale 1-2 puffs into the lungs every 6 (six) hours as needed for wheezing or shortness of breath.     amLODipine (NORVASC) 10 MG tablet Take 10 mg by mouth in the morning.     aspirin EC 325 MG EC tablet Take 1 tablet (325 mg total) by mouth daily with breakfast. MUST TAKE AT LEAST 4 WEEKS POSTOP FOR DVT PROPHYLAXIS 30 tablet 0   benzonatate (TESSALON) 100 MG capsule Take 1 capsule (100 mg total) by mouth every 8 (eight) hours. 21 capsule 0   Calcium Carb-Cholecalciferol (CALCIUM 600+D3 PO) Take 1 tablet by mouth in the morning.     citalopram (CELEXA) 20 MG tablet Take 20 mg by mouth in the morning.     cycloSPORINE (RESTASIS) 0.05 % ophthalmic emulsion Place 1 drop into both eyes 2 (two) times daily as needed (dry/irritated eyes).     FARXIGA 10 MG TABS tablet Take 10 mg by mouth every morning.  glipiZIDE (GLUCOTROL XL) 5 MG 24 hr tablet Take 5 mg by mouth daily.     hydrochlorothiazide (HYDRODIURIL) 25 MG tablet Take 25 mg by mouth in the morning.     metFORMIN (GLUCOPHAGE) 1000 MG tablet Take 1,000 mg by mouth every morning.     methocarbamol (ROBAXIN) 500 MG tablet Take 1 tablet (500 mg total) by mouth every 6 (six) hours as needed for muscle spasms. 60 tablet 0   Multiple Vitamin (MULTIVITAMIN) tablet Take 1 tablet by mouth daily.     omega-3 acid ethyl esters (LOVAZA) 1 g capsule Take 1 capsule by mouth 2 (two) times daily.     oxyCODONE-acetaminophen (PERCOCET/ROXICET) 5-325 MG tablet Take 1-2 tablets  by mouth every 6 (six) hours as needed for severe pain. 50 tablet 0   pravastatin (PRAVACHOL) 40 MG tablet Take 40 mg by mouth daily.     No current facility-administered medications for this visit.    Allergies as of 11/17/2021 - Review Complete 10/07/2021  Allergen Reaction Noted   Lisinopril Swelling 03/16/2021    Family History  Problem Relation Age of Onset   Colon cancer Neg Hx    Stomach cancer Neg Hx    Esophageal cancer Neg Hx     Social History   Socioeconomic History   Marital status: Married    Spouse name: Not on file   Number of children: Not on file   Years of education: Not on file   Highest education level: Not on file  Occupational History   Not on file  Tobacco Use   Smoking status: Never   Smokeless tobacco: Never  Substance and Sexual Activity   Alcohol use: Never   Drug use: Never   Sexual activity: Not on file  Other Topics Concern   Not on file  Social History Narrative   Not on file   Social Determinants of Health   Financial Resource Strain: Not on file  Food Insecurity: Not on file  Transportation Needs: Not on file  Physical Activity: Not on file  Stress: Not on file  Social Connections: Not on file    Review of Systems: Gen: Denies fever, chills, cold or flulike symptoms, presyncope, syncope. CV: Denies chest pain, palpitations. Resp: Denies dyspnea, cough GI: See HPI Heme: See HPI  Physical Exam: There were no vitals taken for this visit. General:   Alert and oriented. No distress noted. Pleasant and cooperative.  Head:  Normocephalic and atraumatic. Eyes:  Conjuctiva clear without scleral icterus. Heart:  S1, S2 present without murmurs appreciated. Lungs:  Clear to auscultation bilaterally. No wheezes, rales, or rhonchi. No distress.  Abdomen:  +BS, soft, non-tender and non-distended. No rebound or guarding. No HSM or masses noted. Msk:  Symmetrical without gross deformities. Normal posture. Extremities:  Without  edema. Neurologic:  Alert and  oriented x4 Psych:  Normal mood and affect.    Assessment:  66 year old female presenting today for follow-up of solid food and pill dysphagia and H pylori s/p EGD completed 03/02/2021 revealing small hiatal hernia, grade C reflux esophagitis, mild Schatzki's ring dilated, gastritis biopsied.  Gastric biopsies positive for H. pylori, esophageal biopsy consistent with reflux changes.  Recommended treatment with Prevpac, then start omeprazole 20 mg twice daily.  Dysphagia:  In the setting of grade C reflux esophagitis and Schatzki's ring.   H. pylori gastritis: She was started on Prevpac with prescription sent on 03/05/2021.  She was seen in the emergency room on 03/15/2021 for angioedema.  The suspected  culprit was lisinopril, but antibiotics for H. pylori were also discontinued.  GERD: Previously denies any frequent, typical reflux symptoms, but EGD with grade C reflux esophagitis.   Plan:  ***   [name], PA-C [facility] 11/17/2021

## 2021-11-16 DIAGNOSIS — M25552 Pain in left hip: Secondary | ICD-10-CM | POA: Diagnosis not present

## 2021-11-16 DIAGNOSIS — R262 Difficulty in walking, not elsewhere classified: Secondary | ICD-10-CM | POA: Diagnosis not present

## 2021-11-17 ENCOUNTER — Encounter: Payer: Self-pay | Admitting: Internal Medicine

## 2021-11-17 ENCOUNTER — Ambulatory Visit: Payer: Medicaid Other | Admitting: Gastroenterology

## 2021-11-18 ENCOUNTER — Telehealth: Payer: Self-pay | Admitting: *Deleted

## 2021-11-18 NOTE — Telephone Encounter (Signed)
Attempted Ortho bundle call to patient. No answer and left VM requesting call back.

## 2021-11-19 DIAGNOSIS — M25552 Pain in left hip: Secondary | ICD-10-CM | POA: Diagnosis not present

## 2021-11-19 DIAGNOSIS — R262 Difficulty in walking, not elsewhere classified: Secondary | ICD-10-CM | POA: Diagnosis not present

## 2021-11-24 DIAGNOSIS — R262 Difficulty in walking, not elsewhere classified: Secondary | ICD-10-CM | POA: Diagnosis not present

## 2021-11-24 DIAGNOSIS — M25552 Pain in left hip: Secondary | ICD-10-CM | POA: Diagnosis not present

## 2021-11-24 NOTE — Telephone Encounter (Signed)
Attempted 90 day Ortho bundle call. No answer and left VM requesting call back.

## 2021-11-30 DIAGNOSIS — R262 Difficulty in walking, not elsewhere classified: Secondary | ICD-10-CM | POA: Diagnosis not present

## 2021-11-30 DIAGNOSIS — M25552 Pain in left hip: Secondary | ICD-10-CM | POA: Diagnosis not present

## 2021-12-03 DIAGNOSIS — M25552 Pain in left hip: Secondary | ICD-10-CM | POA: Diagnosis not present

## 2021-12-03 DIAGNOSIS — R262 Difficulty in walking, not elsewhere classified: Secondary | ICD-10-CM | POA: Diagnosis not present

## 2021-12-27 DIAGNOSIS — I10 Essential (primary) hypertension: Secondary | ICD-10-CM | POA: Diagnosis not present

## 2021-12-27 DIAGNOSIS — J4541 Moderate persistent asthma with (acute) exacerbation: Secondary | ICD-10-CM | POA: Diagnosis not present

## 2021-12-27 DIAGNOSIS — M25552 Pain in left hip: Secondary | ICD-10-CM | POA: Diagnosis not present

## 2021-12-27 DIAGNOSIS — E1169 Type 2 diabetes mellitus with other specified complication: Secondary | ICD-10-CM | POA: Diagnosis not present

## 2022-01-13 ENCOUNTER — Ambulatory Visit: Payer: Self-pay

## 2022-01-13 ENCOUNTER — Ambulatory Visit (INDEPENDENT_AMBULATORY_CARE_PROVIDER_SITE_OTHER): Payer: Medicare Other | Admitting: Orthopaedic Surgery

## 2022-01-13 ENCOUNTER — Encounter: Payer: Self-pay | Admitting: Orthopaedic Surgery

## 2022-01-13 VITALS — Ht 64.0 in | Wt 171.0 lb

## 2022-01-13 DIAGNOSIS — M25561 Pain in right knee: Secondary | ICD-10-CM

## 2022-01-13 DIAGNOSIS — M25562 Pain in left knee: Secondary | ICD-10-CM | POA: Diagnosis not present

## 2022-01-13 DIAGNOSIS — Z96642 Presence of left artificial hip joint: Secondary | ICD-10-CM

## 2022-01-13 DIAGNOSIS — G8929 Other chronic pain: Secondary | ICD-10-CM | POA: Diagnosis not present

## 2022-01-13 MED ORDER — METHYLPREDNISOLONE ACETATE 40 MG/ML IJ SUSP
40.0000 mg | INTRAMUSCULAR | Status: AC | PRN
  Administered 2022-01-13: 40 mg via INTRA_ARTICULAR

## 2022-01-13 MED ORDER — LIDOCAINE HCL 1 % IJ SOLN
0.5000 mL | INTRAMUSCULAR | Status: AC | PRN
  Administered 2022-01-13: .5 mL

## 2022-01-13 MED ORDER — BUPIVACAINE HCL 0.5 % IJ SOLN
3.0000 mL | INTRAMUSCULAR | Status: AC | PRN
  Administered 2022-01-13: 3 mL via INTRA_ARTICULAR

## 2022-01-13 NOTE — Progress Notes (Signed)
   Post-Op Visit Note   Patient: Taylor Cohen           Date of Birth: 10/06/1955           MRN: [id number] Visit Date: 01/13/2022 PCP: Neale Burly, MD   Assessment & Plan: Follow-up left total hip arthroplasty.  X-rays look good she has had trouble walking but primarily has pain left knee more than right knee with intermittent swelling.  X-rays today shows bone-on-bone both knees medial compartment.  Injection performed left knee.  Chief Complaint:  Chief Complaint  Patient presents with   Left Hip - Follow-up   Left Knee - Pain   Right Knee - Pain   Visit Diagnoses:  1. Chronic pain of both knees   2. S/P total left hip arthroplasty     Plan: Return in 8 weeks.  Follow-Up Instructions: No follow-ups on file.   Orders:  Orders Placed This Encounter  Procedures   XR HIP UNILAT W OR W/O PELVIS 2-3 VIEWS LEFT   XR Knee 1-2 Views Right   XR Knee 1-2 Views Left   No orders of the defined types were placed in this encounter.   Imaging: No results found.  PMFS History: Patient Active Problem List   Diagnosis Date Noted   S/P total left hip arthroplasty 09/02/2021   Dysphagia 02/15/2021   Past Medical History:  Diagnosis Date   Asthma    Breast cancer ([place])    Around 2004-2005. Left; s/p mastectomy and chemo   Diabetes ([place])    Dyspnea    GERD (gastroesophageal reflux disease)    History of kidney stones    HLD (hyperlipidemia)    HTN (hypertension)     Family History  Problem Relation Age of Onset   Colon cancer Neg Hx    Stomach cancer Neg Hx    Esophageal cancer Neg Hx     Past Surgical History:  Procedure Laterality Date   BALLOON DILATION N/A 03/02/2021   Procedure: BALLOON DILATION;  Surgeon: Eloise Harman, DO;  [facility];  Service: Endoscopy;  Laterality: N/A;   BIOPSY  03/02/2021   Procedure: BIOPSY;  Surgeon: Eloise Harman, DO;  [facility];  Service: Endoscopy;;   BREAST SURGERY     left mastectomy    COLONOSCOPY     [place]   ESOPHAGOGASTRODUODENOSCOPY (EGD) WITH PROPOFOL N/A 03/02/2021   Procedure: ESOPHAGOGASTRODUODENOSCOPY (EGD) WITH PROPOFOL;  Surgeon: Eloise Harman, DO;  [facility];  Service: Endoscopy;  Laterality: N/A;  10:00am   MASTECTOMY Left    2004 or 2005   TOTAL HIP ARTHROPLASTY Left 08/18/2021   Procedure: Left TOTAL HIP ARTHROPLASTY ANTERIOR APPROACH;  Surgeon: Marybelle Killings, MD;  Location: [place];  Service: Orthopedics;  Laterality: Left;   TUBAL LIGATION     Social History   Occupational History   Not on file  Tobacco Use   Smoking status: Never   Smokeless tobacco: Never  Substance and Sexual Activity   Alcohol use: Never   Drug use: Never   Sexual activity: Not on file

## 2022-01-13 NOTE — Progress Notes (Signed)
Office Visit Note   Patient: Taylor Cohen           Date of Birth: 04/14/1956           MRN: [id number] Visit Date: 01/13/2022              Requested by: Neale Burly, MD [place],  [place] [id number] PCP: Neale Burly, MD   Assessment & Plan: Visit Diagnoses:  1. Chronic pain of both knees   2. S/P total left hip arthroplasty     Plan: Recheck 8 weeks.  Follow-Up Instructions: Return in about 8 weeks (around 03/10/2022).   Orders:  Orders Placed This Encounter  Procedures   XR HIP UNILAT W OR W/O PELVIS 2-3 VIEWS LEFT   XR Knee 1-2 Views Right   XR Knee 1-2 Views Left   No orders of the defined types were placed in this encounter.     Procedures: Large Joint Inj: L knee on 01/13/2022 11:41 AM Indications: joint swelling and pain Details: 22 G 1.5 in needle, anterolateral approach  Arthrogram: No  Medications: 0.5 mL lidocaine 1 %; 3 mL bupivacaine 0.5 %; 40 mg methylPREDNISolone acetate 40 MG/ML Outcome: tolerated well, no immediate complications Procedure, treatment alternatives, risks and benefits explained, specific risks discussed. Consent was given by the patient. Immediately prior to procedure a time out was called to verify the correct patient, procedure, equipment, support staff and site/side marked as required. Patient was prepped and draped in the usual sterile fashion.       Clinical Data: No additional findings.   Subjective: Chief Complaint  Patient presents with   Left Hip - Follow-up   Left Knee - Pain   Right Knee - Pain    HPI  Review of Systems   Objective: Vital Signs: Ht '5\' 4"'$  (1.626 m)   Wt 171 lb (77.6 kg)   BMI 29.35 kg/m   Physical Exam  Ortho Exam  Specialty Comments:  No specialty comments available.  Imaging: No results found.   PMFS History: Patient Active Problem List   Diagnosis Date Noted   S/P total left hip arthroplasty 09/02/2021   Dysphagia 02/15/2021   Past Medical  History:  Diagnosis Date   Asthma    Breast cancer ([place])    Around 2004-2005. Left; s/p mastectomy and chemo   Diabetes ([facility])    Dyspnea    GERD (gastroesophageal reflux disease)    History of kidney stones    HLD (hyperlipidemia)    HTN (hypertension)     Family History  Problem Relation Age of Onset   Colon cancer Neg Hx    Stomach cancer Neg Hx    Esophageal cancer Neg Hx     Past Surgical History:  Procedure Laterality Date   BALLOON DILATION N/A 03/02/2021   Procedure: BALLOON DILATION;  Surgeon: Eloise Harman, DO;  [facility];  Service: Endoscopy;  Laterality: N/A;   BIOPSY  03/02/2021   Procedure: BIOPSY;  Surgeon: Eloise Harman, DO;  [facility];  Service: Endoscopy;;   BREAST SURGERY     left mastectomy   COLONOSCOPY     [facility]   ESOPHAGOGASTRODUODENOSCOPY (EGD) WITH PROPOFOL N/A 03/02/2021   Procedure: ESOPHAGOGASTRODUODENOSCOPY (EGD) WITH PROPOFOL;  Surgeon: Eloise Harman, DO;  [facility];  Service: Endoscopy;  Laterality: N/A;  10:00am   MASTECTOMY Left    2004 or 2005   TOTAL HIP ARTHROPLASTY Left 08/18/2021  Procedure: Left TOTAL HIP ARTHROPLASTY ANTERIOR APPROACH;  Surgeon: Marybelle Killings, MD;  Location: [facility];  Service: Orthopedics;  Laterality: Left;   TUBAL LIGATION     Social History   Occupational History   Not on file  Tobacco Use   Smoking status: Never   Smokeless tobacco: Never  Substance and Sexual Activity   Alcohol use: Never   Drug use: Never   Sexual activity: Not on file

## 2022-03-24 ENCOUNTER — Ambulatory Visit (INDEPENDENT_AMBULATORY_CARE_PROVIDER_SITE_OTHER): Payer: Medicare Other | Admitting: Orthopaedic Surgery

## 2022-03-24 ENCOUNTER — Encounter: Payer: Self-pay | Admitting: Orthopaedic Surgery

## 2022-03-24 DIAGNOSIS — M1712 Unilateral primary osteoarthritis, left knee: Secondary | ICD-10-CM | POA: Diagnosis not present

## 2022-03-24 NOTE — Progress Notes (Signed)
Office Visit Note   Patient: Taylor Cohen           Date of Birth: 24-Aug-1955           MRN: [id number] Visit Date: 03/24/2022              Requested by: Neale Burly, MD [place],  [place] [id number] PCP: Neale Burly, MD   Assessment & Plan: Visit Diagnoses:  1. Unilateral primary osteoarthritis, left knee     Plan: Patient with bone-on-bone medial compartment left knee marginal osteophytes subchondral sclerosis.  She has patellofemoral degenerative changes as well with relative sparing of the lateral compartment.  Plan to be left total knee arthroplasty.  We discussed spinal anesthesia, abductor block, Exparel plus Marcaine.  Discussed CPM, home health physical therapy followed by outpatient therapy and importance of compliance to prevent stiffness and ambulation problems.  Questions were elicited and answered she understands request to proceed with left total knee arthroplasty.  Follow-Up Instructions: No follow-ups on file.   Orders:  No orders of the defined types were placed in this encounter.  No orders of the defined types were placed in this encounter.     Procedures: No procedures performed   Clinical Data: No additional findings.   Subjective: Chief Complaint  Patient presents with   Right Knee - Follow-up, Pain   Left Knee - Follow-up, Pain    HPI 66 year old female returns she is having progressive left knee much worse than right knee pain.  Previous total hip arthroplasty on the left in March 2023 doing well.  Left knee is painful stiff she has had it previously injected taken anti-inflammatories.  Past history of breast cancer 2004 without evidence of recurrence.  Additionally she has some hypertension hyperlipidemia GERD and has had history of kidney stones.  Patient states her left knee is gotten progressively severe and she needs to proceed with total knee arthroplasty since she can get it scheduled.  Patient has diabetes last  A1c 6.6.  Review of Systems positive diabetes previous left total hip arthroplasty doing well.  All systems noncontributory HPI.   Objective: Vital Signs: There were no vitals taken for this visit.  Physical Exam Constitutional:      Appearance: She is well-developed.  HENT:     Head: Normocephalic.     Right Ear: External ear normal.     Left Ear: External ear normal. There is no impacted cerumen.  Eyes:     Pupils: Pupils are equal, round, and reactive to light.  Neck:     Thyroid: No thyromegaly.     Trachea: No tracheal deviation.  Cardiovascular:     Rate and Rhythm: Normal rate.  Pulmonary:     Effort: Pulmonary effort is normal.  Abdominal:     Palpations: Abdomen is soft.  Musculoskeletal:     Cervical back: No rigidity.  Skin:    General: Skin is warm and dry.  Neurological:     Mental Status: She is alert and oriented to person, place, and time.  Psychiatric:        Behavior: Behavior normal.     Ortho Exam negative logroll right and left hip.  Minimal internal rotation right total hip but no groin pain.  No hip flexion contracture.  Crepitus with left knee range of motion worse on the left than right knee.  Medial joint line tenderness palpable osteophytes.  Mild varus deformity distal pulses are intact.  Well-healed left  hip arthroplasty incision anteriorly.  Specialty Comments:  No specialty comments available.  Imaging: No results found.   PMFS History: Patient Active Problem List   Diagnosis Date Noted   Unilateral primary osteoarthritis, left knee 03/24/2022   S/P total left hip arthroplasty 09/02/2021   Dysphagia 02/15/2021   Past Medical History:  Diagnosis Date   Asthma    Breast cancer ([facility])    Around 2004-2005. Left; s/p mastectomy and chemo   Diabetes ([facility])    Dyspnea    GERD (gastroesophageal reflux disease)    History of kidney stones    HLD (hyperlipidemia)    HTN (hypertension)     Family History  Problem Relation Age of  Onset   Colon cancer Neg Hx    Stomach cancer Neg Hx    Esophageal cancer Neg Hx     Past Surgical History:  Procedure Laterality Date   BALLOON DILATION N/A 03/02/2021   Procedure: BALLOON DILATION;  Surgeon: Eloise Harman, DO;  [facility];  Service: Endoscopy;  Laterality: N/A;   BIOPSY  03/02/2021   Procedure: BIOPSY;  Surgeon: Eloise Harman, DO;  [facility];  Service: Endoscopy;;   BREAST SURGERY     left mastectomy   COLONOSCOPY     [facility]   ESOPHAGOGASTRODUODENOSCOPY (EGD) WITH PROPOFOL N/A 03/02/2021   Procedure: ESOPHAGOGASTRODUODENOSCOPY (EGD) WITH PROPOFOL;  Surgeon: Eloise Harman, DO;  [facility];  Service: Endoscopy;  Laterality: N/A;  10:00am   MASTECTOMY Left    2004 or 2005   TOTAL HIP ARTHROPLASTY Left 08/18/2021   Procedure: Left TOTAL HIP ARTHROPLASTY ANTERIOR APPROACH;  Surgeon: Marybelle Killings, MD;  Location: [facility];  Service: Orthopedics;  Laterality: Left;   TUBAL LIGATION     Social History   Occupational History   Not on file  Tobacco Use   Smoking status: Never   Smokeless tobacco: Never  Substance and Sexual Activity   Alcohol use: Never   Drug use: Never   Sexual activity: Not on file

## 2022-03-28 DIAGNOSIS — I10 Essential (primary) hypertension: Secondary | ICD-10-CM | POA: Diagnosis not present

## 2022-03-28 DIAGNOSIS — M25552 Pain in left hip: Secondary | ICD-10-CM | POA: Diagnosis not present

## 2022-03-28 DIAGNOSIS — J4541 Moderate persistent asthma with (acute) exacerbation: Secondary | ICD-10-CM | POA: Diagnosis not present

## 2022-03-28 DIAGNOSIS — E1169 Type 2 diabetes mellitus with other specified complication: Secondary | ICD-10-CM | POA: Diagnosis not present

## 2022-03-30 ENCOUNTER — Telehealth: Payer: Self-pay

## 2022-03-30 NOTE — Telephone Encounter (Signed)
FYI - patient to be scheduled for left TKA.  She called me today and reported her A1c drawn Monday was 7.9.  I told her that was too high.  Needs to be 7.7 or less.  She was given instructions by her PCP of things to do to try to lower it.  She will work on that and we will aim for drawing another A1c in a month to see her progress.

## 2022-04-21 NOTE — Telephone Encounter (Signed)
Last A1c was 03-28-22.  When can you arrange for her to go and get it checked again in [place]?  Thanks!

## 2022-04-25 NOTE — Telephone Encounter (Signed)
I left voicemail for patient. Carlon in [place] office will fix lab order for her to pick up and go to hospital to have drawn.

## 2022-04-26 ENCOUNTER — Other Ambulatory Visit: Payer: Self-pay | Admitting: Radiology

## 2022-04-26 DIAGNOSIS — E1169 Type 2 diabetes mellitus with other specified complication: Secondary | ICD-10-CM

## 2022-06-28 DIAGNOSIS — S61221S Laceration with foreign body of left index finger without damage to nail, sequela: Secondary | ICD-10-CM | POA: Diagnosis not present

## 2022-06-28 DIAGNOSIS — J4530 Mild persistent asthma, uncomplicated: Secondary | ICD-10-CM | POA: Diagnosis not present

## 2022-06-28 DIAGNOSIS — I10 Essential (primary) hypertension: Secondary | ICD-10-CM | POA: Diagnosis not present

## 2022-06-28 DIAGNOSIS — Z0000 Encounter for general adult medical examination without abnormal findings: Secondary | ICD-10-CM | POA: Diagnosis not present

## 2022-06-28 DIAGNOSIS — E782 Mixed hyperlipidemia: Secondary | ICD-10-CM | POA: Diagnosis not present

## 2022-06-28 DIAGNOSIS — M25552 Pain in left hip: Secondary | ICD-10-CM | POA: Diagnosis not present

## 2022-06-28 DIAGNOSIS — E1169 Type 2 diabetes mellitus with other specified complication: Secondary | ICD-10-CM | POA: Diagnosis not present

## 2022-06-28 NOTE — Telephone Encounter (Signed)
Spoke with patient, she had new A1c done today.  She will call me back when she receives results.  I will send clearance request with A1c result request to PCP.

## 2022-07-13 ENCOUNTER — Other Ambulatory Visit: Payer: Self-pay | Admitting: Physician Assistant

## 2022-07-18 ENCOUNTER — Other Ambulatory Visit: Payer: Self-pay | Admitting: Physician Assistant

## 2022-07-18 ENCOUNTER — Encounter: Payer: Self-pay | Admitting: Physician Assistant

## 2022-07-18 ENCOUNTER — Ambulatory Visit (INDEPENDENT_AMBULATORY_CARE_PROVIDER_SITE_OTHER): Payer: 59 | Admitting: Physician Assistant

## 2022-07-18 VITALS — BP 143/62 | HR 71 | Ht 63.5 in | Wt 170.0 lb

## 2022-07-18 DIAGNOSIS — M1712 Unilateral primary osteoarthritis, left knee: Secondary | ICD-10-CM

## 2022-07-18 NOTE — H&P (View-Only) (Signed)
TOTAL KNEE ADMISSION H&P  Patient is being admitted for left total knee arthroplasty.  Subjective:  Chief Complaint:left knee pain.  HPI: Rebcca Cohen, 67 y.o. female, has a history of pain and functional disability in the left knee due to arthritis and has failed non-surgical conservative treatments for greater than 12 weeks to includeNSAID's and/or analgesics, supervised PT with diminished ADL's post treatment, and activity modification.  Onset of symptoms was gradual, starting 3 years ago with gradually worsening course since that time. The patient noted no past surgery on the left knee(s).  Patient currently rates pain in the left knee(s) at 4 out of 10 with activity. Patient has worsening of pain with activity and weight bearing.  Patient has evidence of subchondral cysts and subchondral sclerosis by imaging studies. This patient has had  There is no active infection.  Patient Active Problem List   Diagnosis Date Noted   Unilateral primary osteoarthritis, left knee 03/24/2022   S/P total left hip arthroplasty 09/02/2021   Dysphagia 02/15/2021   Past Medical History:  Diagnosis Date   Asthma    Breast cancer ([place] [facility])    Around 2004-2005. Left; s/p mastectomy and chemo   Diabetes ([facility])    Dyspnea    GERD (gastroesophageal reflux disease)    History of kidney stones    HLD (hyperlipidemia)    HTN (hypertension)     Past Surgical History:  Procedure Laterality Date   BALLOON DILATION N/A 03/02/2021   Procedure: BALLOON DILATION;  Surgeon: Eloise Harman, DO;  [facility];  Service: Endoscopy;  Laterality: N/A;   BIOPSY  03/02/2021   Procedure: BIOPSY;  Surgeon: Eloise Harman, DO;  [facility];  Service: Endoscopy;;   BREAST SURGERY     left mastectomy   COLONOSCOPY     [facility]   ESOPHAGOGASTRODUODENOSCOPY (EGD) WITH PROPOFOL N/A 03/02/2021   Procedure: ESOPHAGOGASTRODUODENOSCOPY (EGD) WITH PROPOFOL;  Surgeon: Eloise Harman, DO;   [facility];  Service: Endoscopy;  Laterality: N/A;  10:00am   MASTECTOMY Left    2004 or 2005   TOTAL HIP ARTHROPLASTY Left 08/18/2021   Procedure: Left TOTAL HIP ARTHROPLASTY ANTERIOR APPROACH;  Surgeon: Marybelle Killings, MD;  Location: [facility];  Service: Orthopedics;  Laterality: Left;   TUBAL LIGATION      Current Outpatient Medications  Medication Sig Dispense Refill Last Dose   albuterol (PROVENTIL) (2.5 MG/3ML) 0.083% nebulizer solution Take 2.5 mg by nebulization every 6 (six) hours as needed for wheezing or shortness of breath.      albuterol (VENTOLIN HFA) 108 (90 Base) MCG/ACT inhaler Inhale 1-2 puffs into the lungs every 6 (six) hours as needed for wheezing or shortness of breath.      amLODipine (NORVASC) 10 MG tablet Take 10 mg by mouth in the morning.      benzonatate (TESSALON) 100 MG capsule Take 1 capsule (100 mg total) by mouth every 8 (eight) hours. (Patient not taking: Reported on 07/18/2022) 21 capsule 0    Calcium Carb-Cholecalciferol (CALCIUM 600+D3 PO) Take 1 tablet by mouth in the morning.      citalopram (CELEXA) 20 MG tablet Take 20 mg by mouth in the morning.      cycloSPORINE (RESTASIS) 0.05 % ophthalmic emulsion Place 1 drop into both eyes 2 (two) times daily as needed (dry/irritated eyes).      FARXIGA 10 MG TABS tablet Take 10 mg by mouth every morning.      glipiZIDE (GLUCOTROL  XL) 5 MG 24 hr tablet Take 5 mg by mouth daily.      hydrochlorothiazide (HYDRODIURIL) 25 MG tablet Take 25 mg by mouth in the morning.      metFORMIN (GLUCOPHAGE) 1000 MG tablet Take 1,000 mg by mouth every morning.      Multiple Vitamin (MULTIVITAMIN) tablet Take 1 tablet by mouth daily.      omega-3 acid ethyl esters (LOVAZA) 1 g capsule Take 1 capsule by mouth 2 (two) times daily.      pravastatin (PRAVACHOL) 40 MG tablet Take 40 mg by mouth daily.      No current facility-administered medications for this visit.   Allergies  Allergen Reactions   Lisinopril Swelling     Social History   Tobacco Use   Smoking status: Never   Smokeless tobacco: Never  Substance Use Topics   Alcohol use: Never    Family History  Problem Relation Age of Onset   Colon cancer Neg Hx    Stomach cancer Neg Hx    Esophageal cancer Neg Hx      Review of Systems  All other systems reviewed and are negative.   Objective:  Physical Exam    Office Visit Note              Patient: Taylor Cohen                                 Date of Birth: 12/27/1955                                                  MRN: [id number] Visit Date: 03/24/2022                                                                     Requested by: Neale Burly, MD [place],  [place] [id number] PCP: Neale Burly, MD     Assessment & Plan: Visit Diagnoses:  1. Unilateral primary osteoarthritis, left knee       Plan: Patient with bone-on-bone medial compartment left knee marginal osteophytes subchondral sclerosis.  She has patellofemoral degenerative changes as well with relative sparing of the lateral compartment.  Plan to be left total knee arthroplasty.  We discussed spinal anesthesia, abductor block, Exparel plus Marcaine.  Discussed CPM, home health physical therapy followed by outpatient therapy and importance of compliance to prevent stiffness and ambulation problems.  Questions were elicited and answered she understands request to proceed with left total knee arthroplasty.  She denies any recent illness.  She says that her most recent hemoglobin A1c was 7.3.   Follow-Up Instructions: No follow-ups on file.    Orders:  No orders of the defined types were placed in this encounter.   No orders of the defined types were placed in this encounter.        Procedures: No procedures performed     Clinical Data: No additional findings.     Subjective:    Chief Complaint  Patient presents with   Right  Knee - Follow-up, Pain   Left Knee - Follow-up, Pain       HPI 67 year old female returns she is having progressive left knee much worse than right knee pain.  Previous total hip arthroplasty on the left in March 2023 doing well.  Left knee is painful stiff she has had it previously injected taken anti-inflammatories.  Past history of breast cancer 2004 without evidence of recurrence.  Additionally she has some hypertension hyperlipidemia GERD and has had history of kidney stones.  Patient states her left knee is gotten progressively severe and she needs to proceed with total knee arthroplasty since she can get it scheduled.  Patient has diabetes last A1c 6.6.   Review of Systems positive diabetes previous left total hip arthroplasty doing well.  All systems noncontributory HPI.     Objective: Vital Signs: There were no vitals taken for this visit.   Physical Exam Constitutional:      Appearance: She is well-developed.  HENT:     Head: Normocephalic.     Right Ear: External ear normal.     Left Ear: External ear normal. There is no impacted cerumen.  Eyes:     Pupils: Pupils are equal, round, and reactive to light.  Neck:     Thyroid: No thyromegaly.     Trachea: No tracheal deviation.  Cardiovascular:     Rate and Rhythm: Normal rate.  Pulmonary:     Effort: Pulmonary effort is normal.  Abdominal:     Palpations: Abdomen is soft.  Musculoskeletal:     Cervical back: No rigidity.  Skin:    General: Skin is warm and dry.  Neurological:     Mental Status: She is alert and oriented to person, place, and time.  Psychiatric:        Behavior: Behavior normal.     Ortho Exam negative logroll right and left hip.  Minimal internal rotation right total hip but no groin pain.  No hip flexion contracture.  Crepitus with left knee range of motion worse on the left than right knee.  Medial joint line tenderness palpable osteophytes.  Mild varus deformity distal pulses are intact.  Well-healed left hip arthroplasty incision anteriorly.  Vital signs in  last 24 hours: '@VSRANGES'$ @  Labs:   Estimated body mass index is 29.64 kg/m as calculated from the following:   Height as of an earlier encounter on 07/18/22: 5' 3.5" (1.613 m).   Weight as of an earlier encounter on 07/18/22: 77.1 kg.   Imaging Review Plain radiographs demonstrate moderate degenerative joint disease of the left knee(s). The overall alignment isneutral. The bone quality appears to be good for age and reported activity level.      Assessment/Plan:  End stage arthritis, left knee   The patient history, physical examination, clinical judgment of the provider and imaging studies are consistent with end stage degenerative joint disease of the left knee(s) and total knee arthroplasty is deemed medically necessary. The treatment options including medical management, injection therapy arthroscopy and arthroplasty were discussed at length. The risks and benefits of total knee arthroplasty were presented and reviewed. The risks due to aseptic loosening, infection, stiffness, patella tracking problems, thromboembolic complications and other imponderables were discussed. The patient acknowledged the explanation, agreed to proceed with the plan and consent was signed. Patient is being admitted for inpatient treatment for surgery, pain control, PT, OT, prophylactic antibiotics, VTE prophylaxis, progressive ambulation and ADL's and discharge planning. The patient is planning to be discharged home with  home health services     Patient's anticipated LOS is less than 2 midnights, meeting these requirements: - Younger than 60 - Lives within 1 hour of care - Has a competent adult at home to recover with post-op recover - NO history of  - Chronic pain requiring opiods  - Diabetes  - Coronary Artery Disease  - Heart failure  - Heart attack  - Stroke  - DVT/VTE  - Cardiac arrhythmia  - Respiratory Failure/COPD  - Renal failure  - Anemia  - Advanced Liver disease

## 2022-07-18 NOTE — H&P (Signed)
TOTAL KNEE ADMISSION H&P  Patient is being admitted for left total knee arthroplasty.  Subjective:  Chief Complaint:left knee pain.  HPI: Taylor Cohen, 67 y.o. female, has a history of pain and functional disability in the left knee due to arthritis and has failed non-surgical conservative treatments for greater than 12 weeks to includeNSAID's and/or analgesics, supervised PT with diminished ADL's post treatment, and activity modification.  Onset of symptoms was gradual, starting 3 years ago with gradually worsening course since that time. The patient noted no past surgery on the left knee(s).  Patient currently rates pain in the left knee(s) at 4 out of 10 with activity. Patient has worsening of pain with activity and weight bearing.  Patient has evidence of subchondral cysts and subchondral sclerosis by imaging studies. This patient has had  There is no active infection.  Patient Active Problem List   Diagnosis Date Noted   Unilateral primary osteoarthritis, left knee 03/24/2022   S/P total left hip arthroplasty 09/02/2021   Dysphagia 02/15/2021   Past Medical History:  Diagnosis Date   Asthma    Breast cancer ([place])    Around 2004-2005. Left; s/p mastectomy and chemo   Diabetes ([place])    Dyspnea    GERD (gastroesophageal reflux disease)    History of kidney stones    HLD (hyperlipidemia)    HTN (hypertension)     Past Surgical History:  Procedure Laterality Date   BALLOON DILATION N/A 03/02/2021   Procedure: BALLOON DILATION;  Surgeon: Eloise Harman, DO;  [facility];  Service: Endoscopy;  Laterality: N/A;   BIOPSY  03/02/2021   Procedure: BIOPSY;  Surgeon: Eloise Harman, DO;  [facility];  Service: Endoscopy;;   BREAST SURGERY     left mastectomy   COLONOSCOPY     [facility]   ESOPHAGOGASTRODUODENOSCOPY (EGD) WITH PROPOFOL N/A 03/02/2021   Procedure: ESOPHAGOGASTRODUODENOSCOPY (EGD) WITH PROPOFOL;  Surgeon: Eloise Harman, DO;   [facility];  Service: Endoscopy;  Laterality: N/A;  10:00am   MASTECTOMY Left    2004 or 2005   TOTAL HIP ARTHROPLASTY Left 08/18/2021   Procedure: Left TOTAL HIP ARTHROPLASTY ANTERIOR APPROACH;  Surgeon: Marybelle Killings, MD;  Location: [facility];  Service: Orthopedics;  Laterality: Left;   TUBAL LIGATION      Current Outpatient Medications  Medication Sig Dispense Refill Last Dose   albuterol (PROVENTIL) (2.5 MG/3ML) 0.083% nebulizer solution Take 2.5 mg by nebulization every 6 (six) hours as needed for wheezing or shortness of breath.      albuterol (VENTOLIN HFA) 108 (90 Base) MCG/ACT inhaler Inhale 1-2 puffs into the lungs every 6 (six) hours as needed for wheezing or shortness of breath.      amLODipine (NORVASC) 10 MG tablet Take 10 mg by mouth in the morning.      benzonatate (TESSALON) 100 MG capsule Take 1 capsule (100 mg total) by mouth every 8 (eight) hours. (Patient not taking: Reported on 07/18/2022) 21 capsule 0    Calcium Carb-Cholecalciferol (CALCIUM 600+D3 PO) Take 1 tablet by mouth in the morning.      citalopram (CELEXA) 20 MG tablet Take 20 mg by mouth in the morning.      cycloSPORINE (RESTASIS) 0.05 % ophthalmic emulsion Place 1 drop into both eyes 2 (two) times daily as needed (dry/irritated eyes).      FARXIGA 10 MG TABS tablet Take 10 mg by mouth every morning.      glipiZIDE (GLUCOTROL  XL) 5 MG 24 hr tablet Take 5 mg by mouth daily.      hydrochlorothiazide (HYDRODIURIL) 25 MG tablet Take 25 mg by mouth in the morning.      metFORMIN (GLUCOPHAGE) 1000 MG tablet Take 1,000 mg by mouth every morning.      Multiple Vitamin (MULTIVITAMIN) tablet Take 1 tablet by mouth daily.      omega-3 acid ethyl esters (LOVAZA) 1 g capsule Take 1 capsule by mouth 2 (two) times daily.      pravastatin (PRAVACHOL) 40 MG tablet Take 40 mg by mouth daily.      No current facility-administered medications for this visit.   Allergies  Allergen Reactions   Lisinopril Swelling     Social History   Tobacco Use   Smoking status: Never   Smokeless tobacco: Never  Substance Use Topics   Alcohol use: Never    Family History  Problem Relation Age of Onset   Colon cancer Neg Hx    Stomach cancer Neg Hx    Esophageal cancer Neg Hx      Review of Systems  All other systems reviewed and are negative.   Objective:  Physical Exam    Office Visit Note              Patient: Taylor Cohen                                 Date of Birth: 08/11/1955                                                  MRN: [id number] Visit Date: 03/24/2022                                                                     Requested by: Neale Burly, MD [place],  [place] [id number] PCP: Neale Burly, MD     Assessment & Plan: Visit Diagnoses:  1. Unilateral primary osteoarthritis, left knee       Plan: Patient with bone-on-bone medial compartment left knee marginal osteophytes subchondral sclerosis.  She has patellofemoral degenerative changes as well with relative sparing of the lateral compartment.  Plan to be left total knee arthroplasty.  We discussed spinal anesthesia, abductor block, Exparel plus Marcaine.  Discussed CPM, home health physical therapy followed by outpatient therapy and importance of compliance to prevent stiffness and ambulation problems.  Questions were elicited and answered she understands request to proceed with left total knee arthroplasty.  She denies any recent illness.  She says that her most recent hemoglobin A1c was 7.3.   Follow-Up Instructions: No follow-ups on file.    Orders:  No orders of the defined types were placed in this encounter.   No orders of the defined types were placed in this encounter.        Procedures: No procedures performed     Clinical Data: No additional findings.     Subjective:    Chief Complaint  Patient presents with   Right  Knee - Follow-up, Pain   Left Knee - Follow-up, Pain       HPI 67 year old female returns she is having progressive left knee much worse than right knee pain.  Previous total hip arthroplasty on the left in March 2023 doing well.  Left knee is painful stiff she has had it previously injected taken anti-inflammatories.  Past history of breast cancer 2004 without evidence of recurrence.  Additionally she has some hypertension hyperlipidemia GERD and has had history of kidney stones.  Patient states her left knee is gotten progressively severe and she needs to proceed with total knee arthroplasty since she can get it scheduled.  Patient has diabetes last A1c 6.6.   Review of Systems positive diabetes previous left total hip arthroplasty doing well.  All systems noncontributory HPI.     Objective: Vital Signs: There were no vitals taken for this visit.   Physical Exam Constitutional:      Appearance: She is well-developed.  HENT:     Head: Normocephalic.     Right Ear: External ear normal.     Left Ear: External ear normal. There is no impacted cerumen.  Eyes:     Pupils: Pupils are equal, round, and reactive to light.  Neck:     Thyroid: No thyromegaly.     Trachea: No tracheal deviation.  Cardiovascular:     Rate and Rhythm: Normal rate.  Pulmonary:     Effort: Pulmonary effort is normal.  Abdominal:     Palpations: Abdomen is soft.  Musculoskeletal:     Cervical back: No rigidity.  Skin:    General: Skin is warm and dry.  Neurological:     Mental Status: She is alert and oriented to person, place, and time.  Psychiatric:        Behavior: Behavior normal.     Ortho Exam negative logroll right and left hip.  Minimal internal rotation right total hip but no groin pain.  No hip flexion contracture.  Crepitus with left knee range of motion worse on the left than right knee.  Medial joint line tenderness palpable osteophytes.  Mild varus deformity distal pulses are intact.  Well-healed left hip arthroplasty incision anteriorly.  Vital signs in  last 24 hours: '@VSRANGES'$ @  Labs:   Estimated body mass index is 29.64 kg/m as calculated from the following:   Height as of an earlier encounter on 07/18/22: 5' 3.5" (1.613 m).   Weight as of an earlier encounter on 07/18/22: 77.1 kg.   Imaging Review Plain radiographs demonstrate moderate degenerative joint disease of the left knee(s). The overall alignment isneutral. The bone quality appears to be good for age and reported activity level.      Assessment/Plan:  End stage arthritis, left knee   The patient history, physical examination, clinical judgment of the provider and imaging studies are consistent with end stage degenerative joint disease of the left knee(s) and total knee arthroplasty is deemed medically necessary. The treatment options including medical management, injection therapy arthroscopy and arthroplasty were discussed at length. The risks and benefits of total knee arthroplasty were presented and reviewed. The risks due to aseptic loosening, infection, stiffness, patella tracking problems, thromboembolic complications and other imponderables were discussed. The patient acknowledged the explanation, agreed to proceed with the plan and consent was signed. Patient is being admitted for inpatient treatment for surgery, pain control, PT, OT, prophylactic antibiotics, VTE prophylaxis, progressive ambulation and ADL's and discharge planning. The patient is planning to be discharged home with  home health services     Patient's anticipated LOS is less than 2 midnights, meeting these requirements: - Younger than 60 - Lives within 1 hour of care - Has a competent adult at home to recover with post-op recover - NO history of  - Chronic pain requiring opiods  - Diabetes  - Coronary Artery Disease  - Heart failure  - Heart attack  - Stroke  - DVT/VTE  - Cardiac arrhythmia  - Respiratory Failure/COPD  - Renal failure  - Anemia  - Advanced Liver disease

## 2022-07-18 NOTE — Progress Notes (Signed)
Office Visit Note   Patient: Taylor Cohen           Date of Birth: 30-Apr-1956           MRN: [id number] Visit Date: 07/18/2022              Requested by: Neale Burly, MD [place],  [place] [id number] PCP: Neale Burly, MD  Chief Complaint  Patient presents with   history and physical    07/25/2022 Left TKA -Lorin Mercy      HPI: Patient is a pleasant 67 year old woman who is status post left hip replacement with Dr. Lorin Mercy about a year ago.  She also has increasingly difficulties with her left knee.  She had a long discussion with Dr. Lorin Mercy and she has advanced tricompartmental arthritis.  Has elected to undergo a left total knee arthroplasty.  Her health has been stable no history of blood clots.  She has good help at home.  She has a history of diabetes last recorded A1c was about a year ago it was 6.6 though she quotes 1 being more recent at 7.3  Assessment & Plan: Visit Diagnoses: Osteoarthritis left knee. Plan: Reviewed the risks of surgery with her which include but are not limited to bleeding infection anesthesia complications blood clots need for future surgery.  She understands that the surgery will ask her to start moving her knee to prevent stiffness.  Plan will stay overnight and go home with home health PT and then transition to outpatient PT.  Full H&P is dictated in the hospital system  Follow-Up Instructions: Return in about 1 week (around 07/25/2022).   Ortho Exam  Patient is alert, oriented, no adenopathy, well-dressed, normal affect, normal respiratory effort. Left knee no erythema no effusion skin is intact.  Distal CMS is intact  Imaging: No results found. No images are attached to the encounter.  Labs: Lab Results  Component Value Date   HGBA1C 6.6 (H) 08/10/2021     Lab Results  Component Value Date   ALBUMIN 4.1 08/10/2021    No results found for: "MG" No results found for: "VD25OH"  No results found for: "PREALBUMIN"     Latest Ref Rng & Units 08/20/2021    3:50 AM 08/19/2021    1:37 AM 08/10/2021    2:00 PM  CBC EXTENDED  WBC 4.0 - 10.5 K/uL 9.5  10.4  5.0   RBC 3.87 - 5.11 MIL/uL 4.27  4.17  5.10   Hemoglobin 12.0 - 15.0 g/dL 11.5  11.2  13.8   HCT 36.0 - 46.0 % 34.7  34.3  42.5   Platelets 150 - 400 K/uL 178  189  256      Body mass index is 29.64 kg/m.  Orders:  No orders of the defined types were placed in this encounter.  No orders of the defined types were placed in this encounter.    Procedures: No procedures performed  Clinical Data: No additional findings.  ROS:  All other systems negative, except as noted in the HPI. Review of Systems  Objective: Vital Signs: BP (!) 143/62   Pulse 71   Ht 5' 3.5" (1.613 m)   Wt 170 lb (77.1 kg)   BMI 29.64 kg/m   Specialty Comments:  No specialty comments available.  PMFS History: Patient Active Problem List   Diagnosis Date Noted   Unilateral primary osteoarthritis, left knee 03/24/2022   S/P total left hip arthroplasty 09/02/2021  Dysphagia 02/15/2021   Past Medical History:  Diagnosis Date   Asthma    Breast cancer ([place])    Around 2004-2005. Left; s/p mastectomy and chemo   Diabetes ([place])    Dyspnea    GERD (gastroesophageal reflux disease)    History of kidney stones    HLD (hyperlipidemia)    HTN (hypertension)     Family History  Problem Relation Age of Onset   Colon cancer Neg Hx    Stomach cancer Neg Hx    Esophageal cancer Neg Hx     Past Surgical History:  Procedure Laterality Date   BALLOON DILATION N/A 03/02/2021   Procedure: BALLOON DILATION;  Surgeon: Eloise Harman, DO;  [facility];  Service: Endoscopy;  Laterality: N/A;   BIOPSY  03/02/2021   Procedure: BIOPSY;  Surgeon: Eloise Harman, DO;  [facility];  Service: Endoscopy;;   BREAST SURGERY     left mastectomy   COLONOSCOPY     [facility]   ESOPHAGOGASTRODUODENOSCOPY (EGD) WITH PROPOFOL N/A 03/02/2021   Procedure:  ESOPHAGOGASTRODUODENOSCOPY (EGD) WITH PROPOFOL;  Surgeon: Eloise Harman, DO;  [facility];  Service: Endoscopy;  Laterality: N/A;  10:00am   MASTECTOMY Left    2004 or 2005   TOTAL HIP ARTHROPLASTY Left 08/18/2021   Procedure: Left TOTAL HIP ARTHROPLASTY ANTERIOR APPROACH;  Surgeon: Marybelle Killings, MD;  Location: [place];  Service: Orthopedics;  Laterality: Left;   TUBAL LIGATION     Social History   Occupational History   Not on file  Tobacco Use   Smoking status: Never   Smokeless tobacco: Never  Substance and Sexual Activity   Alcohol use: Never   Drug use: Never   Sexual activity: Not on file

## 2022-07-19 ENCOUNTER — Telehealth: Payer: Self-pay | Admitting: *Deleted

## 2022-07-19 NOTE — Care Plan (Signed)
Patient seen in office during her H&P appointment for her upcoming Left total knee arthroplasty with Dr. Lorin Mercy on 07/25/22. She is an Ortho bundle patient through [facility] and is agreeable to case management. She will be going to her son's home after discharge so that he can provide assistance. She has a RW as well as a cane. No other DME needed. Anticipate HHPT will be needed after a short hospital stay. Referral made to [facility] after choice provided. Reviewed all post op care instructions with copy provided for patient's review at home. Will continue to follow for needs.

## 2022-07-19 NOTE — Telephone Encounter (Signed)
Ortho bundle pre-op in person meeting completed during H&P on 07/18/22.

## 2022-07-20 NOTE — Pre-Procedure Instructions (Signed)
Surgical Instructions    Your procedure is scheduled on July 25, 2022.  Report to [facility] Main Entrance "A" at 10:30 A.M., then check in with the Admitting office.  Call this number if you have problems the morning of surgery:  [phone]  If you have any questions prior to your surgery date call [phone]: Open Monday-Friday 8am-4pm If you experience any cold or flu symptoms such as cough, fever, chills, shortness of breath, etc. between now and your scheduled surgery, please notify us at the above number.     Remember:  Do not eat after midnight the night before your surgery  You may drink clear liquids until 9:30 AM the morning of your surgery.   Clear liquids allowed are: Water, Non-Citrus Juices (without pulp), Carbonated Beverages, Clear Tea, Black Coffee Only (NO MILK, CREAM OR POWDERED CREAMER of any kind), and Gatorade.     Take these medicines the morning of surgery with A SIP OF WATER:  amLODipine (NORVASC)   citalopram (CELEXA)   pravastatin (PRAVACHOL)    May take these medicines IF NEEDED:  albuterol (PROVENTIL) nebulizer solution   albuterol (VENTOLIN HFA) inhaler   cycloSPORINE (RESTASIS) ophthalmic emulsion    As of today, STOP taking any Aspirin (unless otherwise instructed by your surgeon) Aleve, Naproxen, Ibuprofen, Motrin, Advil, Goody's, BC's, all herbal medications, fish oil, and all vitamins.   WHAT DO I DO ABOUT MY DIABETES MEDICATION?   Do not take metFORMIN (GLUCOPHAGE)  the morning of surgery.  Do not take glipiZIDE (GLUCOTROL XL) the evening before surgery or the morning of surgery.  STOP taking your FARXIGA three days prior to surgery. Your last dose will be February 29th.   HOW TO MANAGE YOUR DIABETES BEFORE AND AFTER SURGERY  Why is it important to control my blood sugar before and after surgery? Improving blood sugar levels before and after surgery helps healing and can limit problems. A way of improving blood sugar control is  eating a healthy diet by:  Eating less sugar and carbohydrates  Increasing activity/exercise  Talking with your doctor about reaching your blood sugar goals High blood sugars (greater than 180 mg/dL) can raise your risk of infections and slow your recovery, so you will need to focus on controlling your diabetes during the weeks before surgery. Make sure that the doctor who takes care of your diabetes knows about your planned surgery including the date and location.  How do I manage my blood sugar before surgery? Check your blood sugar at least 4 times a day, starting 2 days before surgery, to make sure that the level is not too high or low.  Check your blood sugar the morning of your surgery when you wake up and every 2 hours until you get to the Short Stay unit.  If your blood sugar is less than 70 mg/dL, you will need to treat for low blood sugar: Do not take insulin. Treat a low blood sugar (less than 70 mg/dL) with  cup of clear juice (cranberry or apple), 4 glucose tablets, OR glucose gel. Recheck blood sugar in 15 minutes after treatment (to make sure it is greater than 70 mg/dL). If your blood sugar is not greater than 70 mg/dL on recheck, call [phone] for further instructions. Report your blood sugar to the short stay nurse when you get to Short Stay.  If you are admitted to the hospital after surgery: Your blood sugar will be checked by the staff and you will probably be given insulin  after surgery (instead of oral diabetes medicines) to make sure you have good blood sugar levels. The goal for blood sugar control after surgery is 80-180 mg/dL.                      Do NOT Smoke (Tobacco/Vaping) for 24 hours prior to your procedure.  If you use a CPAP at night, you may bring your mask/headgear for your overnight stay.   Contacts, glasses, piercing's, hearing aid's, dentures or partials may not be worn into surgery, please bring cases for these belongings.    For patients  admitted to the hospital, discharge time will be determined by your treatment team.   Patients discharged the day of surgery will not be allowed to drive home, and someone needs to stay with them for 24 hours.  SURGICAL WAITING ROOM VISITATION Patients having surgery or a procedure may have no more than 2 support people in the waiting area - these visitors may rotate.   Children under the age of 70 must have an adult with them who is not the patient. If the patient needs to stay at the hospital during part of their recovery, the visitor guidelines for inpatient rooms apply. Pre-op nurse will coordinate an appropriate time for 1 support person to accompany patient in pre-op.  This support person may not rotate.   Please refer to the [facility] website for the visitor guidelines for Inpatients (after your surgery is over and you are in a regular room).    Special instructions:   [facility]- Preparing For Surgery  Before surgery, you can play an important role. Because skin is not sterile, your skin needs to be as free of germs as possible. You can reduce the number of germs on your skin by washing with CHG (chlorahexidine gluconate) Soap before surgery.  CHG is an antiseptic cleaner which kills germs and bonds with the skin to continue killing germs even after washing.    Oral Hygiene is also important to reduce your risk of infection.  Remember - BRUSH YOUR TEETH THE MORNING OF SURGERY WITH YOUR REGULAR TOOTHPASTE  Please do not use if you have an allergy to CHG or antibacterial soaps. If your skin becomes reddened/irritated stop using the CHG.  Do not shave (including legs and underarms) for at least 48 hours prior to first CHG shower. It is OK to shave your face.  Please follow these instructions carefully.   Shower the NIGHT BEFORE SURGERY and the MORNING OF SURGERY  If you chose to wash your hair, wash your hair first as usual with your normal shampoo.  After you shampoo, rinse your  hair and body thoroughly to remove the shampoo.  Use CHG Soap as you would any other liquid soap. You can apply CHG directly to the skin and wash gently with a scrungie or a clean washcloth.   Apply the CHG Soap to your body ONLY FROM THE NECK DOWN.  Do not use on open wounds or open sores. Avoid contact with your eyes, ears, mouth and genitals (private parts). Wash Face and genitals (private parts)  with your normal soap.   Wash thoroughly, paying special attention to the area where your surgery will be performed.  Thoroughly rinse your body with warm water from the neck down.  DO NOT shower/wash with your normal soap after using and rinsing off the CHG Soap.  Pat yourself dry with a CLEAN TOWEL.  Wear CLEAN PAJAMAS to bed the night before  surgery  Place CLEAN SHEETS on your bed the night before your surgery  DO NOT SLEEP WITH PETS.   Day of Surgery: Take a shower with CHG soap. Do not wear jewelry or makeup Do not wear lotions, powders, perfumes/colognes, or deodorant. Do not shave 48 hours prior to surgery.  Men may shave face and neck. Do not bring valuables to the hospital.  [facility] is not responsible for any belongings or valuables. Do not wear nail polish, gel polish, artificial nails, or any other type of covering on natural nails (fingers and toes) If you have artificial nails or gel coating that need to be removed by a nail salon, please have this removed prior to surgery. Artificial nails or gel coating may interfere with anesthesia's ability to adequately monitor your vital signs.  Wear Clean/Comfortable clothing the morning of surgery Remember to brush your teeth WITH YOUR REGULAR TOOTHPASTE.   Please read over the following fact sheets that you were given.    If you received a COVID test during your pre-op visit  it is requested that you wear a mask when out in public, stay away from anyone that may not be feeling well and notify your surgeon if you develop  symptoms. If you have been in contact with anyone that has tested positive in the last 10 days please notify you surgeon.

## 2022-07-21 ENCOUNTER — Encounter (HOSPITAL_COMMUNITY): Payer: Self-pay

## 2022-07-21 ENCOUNTER — Encounter (HOSPITAL_COMMUNITY)
Admission: RE | Admit: 2022-07-21 | Discharge: 2022-07-21 | Disposition: A | Discharge status: Home / Self Care | Payer: 59 | Source: Ambulatory Visit | Attending: Orthopaedic Surgery | Admitting: Orthopaedic Surgery

## 2022-07-21 ENCOUNTER — Other Ambulatory Visit: Payer: Self-pay

## 2022-07-21 VITALS — BP 144/72 | HR 68 | Temp 97.6°F | Resp 18 | Ht 64.0 in | Wt 172.9 lb

## 2022-07-21 DIAGNOSIS — E119 Type 2 diabetes mellitus without complications: Secondary | ICD-10-CM | POA: Insufficient documentation

## 2022-07-21 DIAGNOSIS — Z01812 Encounter for preprocedural laboratory examination: Secondary | ICD-10-CM | POA: Insufficient documentation

## 2022-07-21 DIAGNOSIS — Z01818 Encounter for other preprocedural examination: Secondary | ICD-10-CM

## 2022-07-21 HISTORY — DX: Pneumonia, unspecified organism: J18.9

## 2022-07-21 HISTORY — DX: Unspecified osteoarthritis, unspecified site: M19.90

## 2022-07-21 HISTORY — DX: Depression, unspecified: F32.A

## 2022-07-21 LAB — CBC
HCT: 41 % (ref 36.0–46.0)
Hemoglobin: 13.8 g/dL (ref 12.0–15.0)
MCH: 27.7 pg (ref 26.0–34.0)
MCHC: 33.7 g/dL (ref 30.0–36.0)
MCV: 82.2 fL (ref 80.0–100.0)
Platelets: 178 10*3/uL (ref 150–400)
RBC: 4.99 MIL/uL (ref 3.87–5.11)
RDW: 14.3 % (ref 11.5–15.5)
WBC: 4.8 10*3/uL (ref 4.0–10.5)
nRBC: 0 % (ref 0.0–0.2)

## 2022-07-21 LAB — GLUCOSE, CAPILLARY: Glucose-Capillary: 138 mg/dL — ABNORMAL HIGH (ref 70–99)

## 2022-07-21 LAB — SURGICAL PCR SCREEN
MRSA, PCR: NEGATIVE
Staphylococcus aureus: NEGATIVE

## 2022-07-21 NOTE — Progress Notes (Addendum)
PCP - Dr. Stoney Bang Cardiologist - Denies  PPM/ICD - Denies Device Orders - n/a Rep Notified - n/a  Chest x-[place] - n/a EKG - 08/10/2021 Stress Test - Denies ECHO - Denies Cardiac Cath - Denies  Sleep Study - Denies CPAP - n/a  Pt is DM2. She checks her blood sugar 3x/day. Normal fasting is around 98. CBG at pre-op was 138. She has only had water so far today  Last dose of GLP1 agonist- n/a GLP1 instructions: n/a  Blood Thinner Instructions: n/a Aspirin Instructions: n/a  ERAS Protcol - Clear liquids until 1000 morning of surgery PRE-SURGERY Ensure or G2- n/a  COVID TEST- n/a   Anesthesia review: Yes. RN spoke with pts PCP office on the phone and they are faxing over BMP and AIC results from pts last visit 06/28/2022.  Patient denies shortness of breath, fever, cough and chest pain at PAT appointment. Pt also denies any respiratory illness/infection in the past two months   All instructions explained to the patient, with a verbal understanding of the material. Patient agrees to go over the instructions while at home for a better understanding. Patient also instructed to self quarantine after being tested for COVID-19. The opportunity to ask questions was provided.

## 2022-07-21 NOTE — Progress Notes (Signed)
Med Rec not complete at time of pre-op appointment. RN unable to reach pharmacy tech to complete. Pt given Pharmacy Call Center phone number and instructed to call later today to review all medications with our pharmacist. If she is unable to get through today, then she needs to call tomorrow. Pt understood instructions.

## 2022-07-25 ENCOUNTER — Observation Stay (HOSPITAL_COMMUNITY)
Admission: RE | Admit: 2022-07-25 | Discharge: 2022-07-26 | Disposition: A | Discharge status: Home / Self Care | Payer: 59 | Attending: Orthopaedic Surgery | Admitting: Orthopaedic Surgery

## 2022-07-25 ENCOUNTER — Ambulatory Visit (HOSPITAL_COMMUNITY): Payer: 59 | Admitting: Physician Assistant

## 2022-07-25 ENCOUNTER — Encounter (HOSPITAL_COMMUNITY): Payer: Self-pay | Admitting: Orthopaedic Surgery

## 2022-07-25 ENCOUNTER — Ambulatory Visit (HOSPITAL_BASED_OUTPATIENT_CLINIC_OR_DEPARTMENT_OTHER): Payer: 59 | Admitting: Anesthesiology

## 2022-07-25 ENCOUNTER — Encounter (HOSPITAL_COMMUNITY)
Admission: RE | Disposition: A | Discharge status: Home / Self Care | Payer: Self-pay | Source: Home / Self Care | Attending: Orthopaedic Surgery

## 2022-07-25 ENCOUNTER — Other Ambulatory Visit: Payer: Self-pay

## 2022-07-25 DIAGNOSIS — E119 Type 2 diabetes mellitus without complications: Secondary | ICD-10-CM

## 2022-07-25 DIAGNOSIS — J45909 Unspecified asthma, uncomplicated: Secondary | ICD-10-CM | POA: Diagnosis not present

## 2022-07-25 DIAGNOSIS — Z7984 Long term (current) use of oral hypoglycemic drugs: Secondary | ICD-10-CM | POA: Diagnosis not present

## 2022-07-25 DIAGNOSIS — I10 Essential (primary) hypertension: Secondary | ICD-10-CM | POA: Diagnosis not present

## 2022-07-25 DIAGNOSIS — Z96659 Presence of unspecified artificial knee joint: Secondary | ICD-10-CM

## 2022-07-25 DIAGNOSIS — M1712 Unilateral primary osteoarthritis, left knee: Secondary | ICD-10-CM

## 2022-07-25 DIAGNOSIS — Z79899 Other long term (current) drug therapy: Secondary | ICD-10-CM | POA: Insufficient documentation

## 2022-07-25 DIAGNOSIS — Z853 Personal history of malignant neoplasm of breast: Secondary | ICD-10-CM | POA: Diagnosis not present

## 2022-07-25 DIAGNOSIS — G8918 Other acute postprocedural pain: Secondary | ICD-10-CM | POA: Diagnosis not present

## 2022-07-25 DIAGNOSIS — Z96642 Presence of left artificial hip joint: Secondary | ICD-10-CM | POA: Diagnosis not present

## 2022-07-25 HISTORY — PX: TOTAL KNEE ARTHROPLASTY: SHX125

## 2022-07-25 LAB — GLUCOSE, CAPILLARY
Glucose-Capillary: 115 mg/dL — ABNORMAL HIGH (ref 70–99)
Glucose-Capillary: 93 mg/dL (ref 70–99)
Glucose-Capillary: 179 mg/dL — ABNORMAL HIGH (ref 70–99)
Glucose-Capillary: 234 mg/dL — ABNORMAL HIGH (ref 70–99)

## 2022-07-25 SURGERY — ARTHROPLASTY, KNEE, TOTAL
Anesthesia: Spinal | Site: Knee | Laterality: Left

## 2022-07-25 MED ORDER — FENTANYL CITRATE (PF) 100 MCG/2ML IJ SOLN
INTRAMUSCULAR | Status: AC
  Administered 2022-07-25: 50 ug
  Filled 2022-07-25: qty 2

## 2022-07-25 MED ORDER — BISACODYL 10 MG RE SUPP: 10.0000 mg | Freq: Every day | RECTAL | Status: DC | PRN

## 2022-07-25 MED ORDER — DOCUSATE SODIUM 100 MG PO CAPS
100.0000 mg | ORAL_CAPSULE | Freq: Two times a day (BID) | ORAL | Status: DC
  Administered 2022-07-25 – 2022-07-26 (×2): 100 mg via ORAL
  Filled 2022-07-25 (×2): qty 1

## 2022-07-25 MED ORDER — ONDANSETRON HCL 4 MG/2ML IJ SOLN: 4.0000 mg | Freq: Once | INTRAMUSCULAR | Status: DC | PRN

## 2022-07-25 MED ORDER — ONDANSETRON HCL 4 MG/2ML IJ SOLN
INTRAMUSCULAR | Status: AC
  Filled 2022-07-25: qty 2

## 2022-07-25 MED ORDER — BUPIVACAINE LIPOSOME 1.3 % IJ SUSP
INTRAMUSCULAR | Status: AC
  Filled 2022-07-25: qty 20

## 2022-07-25 MED ORDER — TRANEXAMIC ACID-NACL 1000-0.7 MG/100ML-% IV SOLN
INTRAVENOUS | Status: AC
  Filled 2022-07-25: qty 100

## 2022-07-25 MED ORDER — MENTHOL 3 MG MT LOZG: 1.0000 | LOZENGE | OROMUCOSAL | Status: DC | PRN

## 2022-07-25 MED ORDER — OXYCODONE HCL 5 MG PO TABS: 5.0000 mg | ORAL_TABLET | Freq: Once | ORAL | Status: DC | PRN

## 2022-07-25 MED ORDER — FERROUS SULFATE 325 (65 FE) MG PO TABS
325.0000 mg | ORAL_TABLET | Freq: Three times a day (TID) | ORAL | Status: DC
  Administered 2022-07-25 – 2022-07-26 (×3): 325 mg via ORAL
  Filled 2022-07-25 (×3): qty 1

## 2022-07-25 MED ORDER — MIDAZOLAM HCL 2 MG/2ML IJ SOLN
INTRAMUSCULAR | Status: AC
  Filled 2022-07-25: qty 2

## 2022-07-25 MED ORDER — DAPAGLIFLOZIN PROPANEDIOL 10 MG PO TABS
10.0000 mg | ORAL_TABLET | Freq: Every morning | ORAL | Status: DC
  Administered 2022-07-26: 10 mg via ORAL
  Filled 2022-07-25: qty 1

## 2022-07-25 MED ORDER — PROPOFOL 1000 MG/100ML IV EMUL
INTRAVENOUS | Status: AC
  Filled 2022-07-25: qty 100

## 2022-07-25 MED ORDER — ONDANSETRON HCL 4 MG/2ML IJ SOLN
INTRAMUSCULAR | Status: DC | PRN
  Administered 2022-07-25: 4 mg via INTRAVENOUS

## 2022-07-25 MED ORDER — HYDROMORPHONE HCL 1 MG/ML IJ SOLN
INTRAMUSCULAR | Status: AC
  Administered 2022-07-25: 0.5 mg via INTRAVENOUS
  Filled 2022-07-25: qty 1

## 2022-07-25 MED ORDER — PHENOL 1.4 % MT LIQD: 1.0000 | OROMUCOSAL | Status: DC | PRN

## 2022-07-25 MED ORDER — LACTATED RINGERS IV SOLN: INTRAVENOUS | Status: DC

## 2022-07-25 MED ORDER — ACETAMINOPHEN 10 MG/ML IV SOLN
INTRAVENOUS | Status: AC
  Administered 2022-07-25: 1000 mg via INTRAVENOUS
  Filled 2022-07-25: qty 100

## 2022-07-25 MED ORDER — CHLORHEXIDINE GLUCONATE 0.12 % MT SOLN
15.0000 mL | Freq: Once | OROMUCOSAL | Status: AC
  Administered 2022-07-25: 15 mL via OROMUCOSAL
  Filled 2022-07-25: qty 15

## 2022-07-25 MED ORDER — OXYCODONE HCL 5 MG PO TABS
5.0000 mg | ORAL_TABLET | ORAL | Status: DC | PRN
  Administered 2022-07-26: 10 mg via ORAL

## 2022-07-25 MED ORDER — DIPHENHYDRAMINE HCL 12.5 MG/5ML PO ELIX: 12.5000 mg | ORAL_SOLUTION | ORAL | Status: DC | PRN

## 2022-07-25 MED ORDER — LIDOCAINE 2% (20 MG/ML) 5 ML SYRINGE
INTRAMUSCULAR | Status: DC | PRN
  Administered 2022-07-25: 20 mg via INTRAVENOUS

## 2022-07-25 MED ORDER — PROPOFOL 500 MG/50ML IV EMUL
INTRAVENOUS | Status: DC | PRN
  Administered 2022-07-25: 25 ug/kg/min via INTRAVENOUS

## 2022-07-25 MED ORDER — OXYCODONE HCL 5 MG/5ML PO SOLN: 5.0000 mg | Freq: Once | ORAL | Status: DC | PRN

## 2022-07-25 MED ORDER — ROPIVACAINE HCL 7.5 MG/ML IJ SOLN
INTRAMUSCULAR | Status: DC | PRN
  Administered 2022-07-25: 20 mL via PERINEURAL

## 2022-07-25 MED ORDER — HYDROCHLOROTHIAZIDE 25 MG PO TABS
25.0000 mg | ORAL_TABLET | Freq: Every morning | ORAL | Status: DC
  Filled 2022-07-25: qty 1

## 2022-07-25 MED ORDER — ACETAMINOPHEN 500 MG PO TABS
1000.0000 mg | ORAL_TABLET | Freq: Four times a day (QID) | ORAL | Status: AC
  Administered 2022-07-25 – 2022-07-26 (×4): 1000 mg via ORAL
  Filled 2022-07-25 (×4): qty 2

## 2022-07-25 MED ORDER — AMLODIPINE BESYLATE 10 MG PO TABS
10.0000 mg | ORAL_TABLET | Freq: Every morning | ORAL | Status: DC
  Filled 2022-07-25: qty 1

## 2022-07-25 MED ORDER — CYCLOSPORINE 0.05 % OP EMUL
1.0000 [drp] | Freq: Two times a day (BID) | OPHTHALMIC | Status: DC
  Administered 2022-07-25 – 2022-07-26 (×2): 1 [drp] via OPHTHALMIC
  Filled 2022-07-25 (×3): qty 30

## 2022-07-25 MED ORDER — DEXAMETHASONE SODIUM PHOSPHATE 10 MG/ML IJ SOLN
INTRAMUSCULAR | Status: DC | PRN
  Administered 2022-07-25 (×2): 5 mg via INTRAVENOUS

## 2022-07-25 MED ORDER — ACETAMINOPHEN 10 MG/ML IV SOLN: 1000.0000 mg | Freq: Once | INTRAVENOUS | Status: DC | PRN

## 2022-07-25 MED ORDER — FENTANYL CITRATE (PF) 250 MCG/5ML IJ SOLN
INTRAMUSCULAR | Status: DC | PRN
  Administered 2022-07-25: 50 ug via INTRAVENOUS

## 2022-07-25 MED ORDER — METFORMIN HCL 500 MG PO TABS
1000.0000 mg | ORAL_TABLET | Freq: Every day | ORAL | Status: DC
  Administered 2022-07-26: 1000 mg via ORAL
  Filled 2022-07-25: qty 2

## 2022-07-25 MED ORDER — ACETAMINOPHEN 325 MG PO TABS: 325.0000 mg | ORAL_TABLET | Freq: Four times a day (QID) | ORAL | Status: DC | PRN

## 2022-07-25 MED ORDER — ONDANSETRON HCL 4 MG/2ML IJ SOLN
4.0000 mg | Freq: Four times a day (QID) | INTRAMUSCULAR | Status: DC | PRN
  Administered 2022-07-26: 4 mg via INTRAVENOUS
  Filled 2022-07-25: qty 2

## 2022-07-25 MED ORDER — ACETAMINOPHEN 10 MG/ML IV SOLN: 1000.0000 mg | Freq: Once | INTRAVENOUS | Status: AC

## 2022-07-25 MED ORDER — TRANEXAMIC ACID-NACL 1000-0.7 MG/100ML-% IV SOLN
INTRAVENOUS | Status: DC | PRN
  Administered 2022-07-25: 1000 mg via INTRAVENOUS

## 2022-07-25 MED ORDER — METOCLOPRAMIDE HCL 5 MG PO TABS: 5.0000 mg | ORAL_TABLET | Freq: Three times a day (TID) | ORAL | Status: DC | PRN

## 2022-07-25 MED ORDER — FENTANYL CITRATE (PF) 250 MCG/5ML IJ SOLN
INTRAMUSCULAR | Status: AC
  Filled 2022-07-25: qty 5

## 2022-07-25 MED ORDER — PROPOFOL 10 MG/ML IV BOLUS
INTRAVENOUS | Status: DC | PRN
  Administered 2022-07-25: 30 mg via INTRAVENOUS
  Administered 2022-07-25: 40 mg via INTRAVENOUS

## 2022-07-25 MED ORDER — BUPIVACAINE HCL (PF) 0.25 % IJ SOLN
INTRAMUSCULAR | Status: DC | PRN
  Administered 2022-07-25: 20 mL

## 2022-07-25 MED ORDER — MIDAZOLAM HCL 2 MG/2ML IJ SOLN
INTRAMUSCULAR | Status: DC | PRN
  Administered 2022-07-25: 1 mg via INTRAVENOUS

## 2022-07-25 MED ORDER — HYDROMORPHONE HCL 1 MG/ML IJ SOLN
0.5000 mg | INTRAMUSCULAR | Status: DC | PRN
  Administered 2022-07-25 – 2022-07-26 (×4): 1 mg via INTRAVENOUS
  Filled 2022-07-25 (×4): qty 1

## 2022-07-25 MED ORDER — ASPIRIN 325 MG PO TBEC
325.0000 mg | DELAYED_RELEASE_TABLET | Freq: Every day | ORAL | Status: DC
  Administered 2022-07-26: 325 mg via ORAL
  Filled 2022-07-25: qty 1

## 2022-07-25 MED ORDER — ONDANSETRON HCL 4 MG PO TABS: 4.0000 mg | ORAL_TABLET | Freq: Four times a day (QID) | ORAL | Status: DC | PRN

## 2022-07-25 MED ORDER — HYDROMORPHONE HCL 1 MG/ML IJ SOLN
0.2500 mg | INTRAMUSCULAR | Status: DC | PRN
  Administered 2022-07-25 (×2): 0.5 mg via INTRAVENOUS

## 2022-07-25 MED ORDER — 0.9 % SODIUM CHLORIDE (POUR BTL) OPTIME
TOPICAL | Status: DC | PRN
  Administered 2022-07-25: 1000 mL

## 2022-07-25 MED ORDER — OXYCODONE HCL 5 MG PO TABS
10.0000 mg | ORAL_TABLET | ORAL | Status: DC | PRN
  Administered 2022-07-25 – 2022-07-26 (×4): 15 mg via ORAL
  Filled 2022-07-25 (×5): qty 3

## 2022-07-25 MED ORDER — BUPIVACAINE LIPOSOME 1.3 % IJ SUSP
INTRAMUSCULAR | Status: DC | PRN
  Administered 2022-07-25: 20 mL

## 2022-07-25 MED ORDER — HYDROMORPHONE HCL 1 MG/ML IJ SOLN
INTRAMUSCULAR | Status: AC
  Filled 2022-07-25: qty 1

## 2022-07-25 MED ORDER — ORAL CARE MOUTH RINSE: 15.0000 mL | Freq: Once | OROMUCOSAL | Status: AC

## 2022-07-25 MED ORDER — EPHEDRINE SULFATE-NACL 50-0.9 MG/10ML-% IV SOSY
PREFILLED_SYRINGE | INTRAVENOUS | Status: DC | PRN
  Administered 2022-07-25 (×2): 5 mg via INTRAVENOUS

## 2022-07-25 MED ORDER — LIDOCAINE 2% (20 MG/ML) 5 ML SYRINGE
INTRAMUSCULAR | Status: AC
  Filled 2022-07-25: qty 5

## 2022-07-25 MED ORDER — ALBUTEROL SULFATE HFA 108 (90 BASE) MCG/ACT IN AERS: 1.0000 | INHALATION_SPRAY | Freq: Four times a day (QID) | RESPIRATORY_TRACT | Status: DC | PRN

## 2022-07-25 MED ORDER — OYSTER SHELL CALCIUM/D3 500-5 MG-MCG PO TABS
1.0000 | ORAL_TABLET | Freq: Every day | ORAL | Status: DC
  Administered 2022-07-26: 1 via ORAL
  Filled 2022-07-25: qty 1

## 2022-07-25 MED ORDER — SODIUM CHLORIDE 0.9 % IR SOLN
Status: DC | PRN
  Administered 2022-07-25: 3000 mL

## 2022-07-25 MED ORDER — CITALOPRAM HYDROBROMIDE 20 MG PO TABS
20.0000 mg | ORAL_TABLET | Freq: Every morning | ORAL | Status: DC
  Administered 2022-07-26: 20 mg via ORAL
  Filled 2022-07-25: qty 1

## 2022-07-25 MED ORDER — SODIUM CHLORIDE 0.9 % IV SOLN: INTRAVENOUS | Status: DC

## 2022-07-25 MED ORDER — CEFAZOLIN SODIUM-DEXTROSE 2-4 GM/100ML-% IV SOLN
2.0000 g | INTRAVENOUS | Status: AC
  Administered 2022-07-25: 2 g via INTRAVENOUS
  Filled 2022-07-25: qty 100

## 2022-07-25 MED ORDER — BISMUTH SUBSALICYLATE 262 MG/15ML PO SUSP: 30.0000 mL | Freq: Four times a day (QID) | ORAL | Status: DC | PRN

## 2022-07-25 MED ORDER — METOCLOPRAMIDE HCL 5 MG/ML IJ SOLN: 5.0000 mg | Freq: Three times a day (TID) | INTRAMUSCULAR | Status: DC | PRN

## 2022-07-25 MED ORDER — PRAVASTATIN SODIUM 40 MG PO TABS
40.0000 mg | ORAL_TABLET | Freq: Every day | ORAL | Status: DC
  Administered 2022-07-26: 40 mg via ORAL
  Filled 2022-07-25: qty 1

## 2022-07-25 MED ORDER — ALBUTEROL SULFATE (2.5 MG/3ML) 0.083% IN NEBU: 2.5000 mg | INHALATION_SOLUTION | Freq: Four times a day (QID) | RESPIRATORY_TRACT | Status: DC | PRN

## 2022-07-25 MED ORDER — DEXAMETHASONE SODIUM PHOSPHATE 10 MG/ML IJ SOLN
INTRAMUSCULAR | Status: AC
  Filled 2022-07-25: qty 1

## 2022-07-25 MED ORDER — BUPIVACAINE IN DEXTROSE 0.75-8.25 % IT SOLN
INTRATHECAL | Status: DC | PRN
  Administered 2022-07-25: 1.6 mL via INTRATHECAL

## 2022-07-25 MED ORDER — BUPIVACAINE HCL (PF) 0.25 % IJ SOLN
INTRAMUSCULAR | Status: AC
  Filled 2022-07-25: qty 30

## 2022-07-25 MED ORDER — HYDROMORPHONE HCL 1 MG/ML IJ SOLN: 0.2500 mg | INTRAMUSCULAR | Status: DC | PRN

## 2022-07-25 MED ORDER — OMEGA-3-ACID ETHYL ESTERS 1 G PO CAPS
1.0000 g | ORAL_CAPSULE | Freq: Two times a day (BID) | ORAL | Status: DC
  Administered 2022-07-25 – 2022-07-26 (×2): 1 g via ORAL
  Filled 2022-07-25 (×3): qty 1

## 2022-07-25 MED ORDER — GLIPIZIDE ER 5 MG PO TB24
5.0000 mg | ORAL_TABLET | Freq: Every day | ORAL | Status: DC
  Administered 2022-07-26: 5 mg via ORAL
  Filled 2022-07-25 (×2): qty 1

## 2022-07-25 MED ORDER — ADULT MULTIVITAMIN W/MINERALS CH
1.0000 | ORAL_TABLET | Freq: Every day | ORAL | Status: DC
  Administered 2022-07-25 – 2022-07-26 (×2): 1 via ORAL
  Filled 2022-07-25 (×2): qty 1

## 2022-07-25 MED ORDER — PROPOFOL 10 MG/ML IV BOLUS
INTRAVENOUS | Status: AC
  Filled 2022-07-25: qty 20

## 2022-07-25 MED ORDER — LORATADINE 10 MG PO TABS
10.0000 mg | ORAL_TABLET | Freq: Every day | ORAL | Status: DC
  Administered 2022-07-25 – 2022-07-26 (×2): 10 mg via ORAL
  Filled 2022-07-25 (×2): qty 1

## 2022-07-25 MED ORDER — PHENYLEPHRINE HCL-NACL 20-0.9 MG/250ML-% IV SOLN
INTRAVENOUS | Status: DC | PRN
  Administered 2022-07-25: 25 ug/min via INTRAVENOUS

## 2022-07-25 MED ORDER — MIDAZOLAM HCL 2 MG/2ML IJ SOLN
INTRAMUSCULAR | Status: AC
  Administered 2022-07-25: 1 mg
  Filled 2022-07-25: qty 2

## 2022-07-25 SURGICAL SUPPLY — 77 items
APL SKNCLS STERI-STRIP NONHPOA (GAUZE/BANDAGES/DRESSINGS) ×1
ATTUNE PS FEM LT SZ 5 CEM KNEE (Femur) IMPLANT
ATTUNE PSRP INSR SZ5 6 KNEE (Insert) IMPLANT
BAG COUNTER SPONGE SURGICOUNT (BAG) ×1 IMPLANT
BAG SPNG CNTER NS LX DISP (BAG) ×1
BANDAGE ESMARK 6X9 LF (GAUZE/BANDAGES/DRESSINGS) ×1 IMPLANT
BASE TIBIAL ROT PLAT SZ 5 KNEE (Knees) IMPLANT
BENZOIN TINCTURE PRP APPL 2/3 (GAUZE/BANDAGES/DRESSINGS) IMPLANT
BLADE SAGITTAL 25.0X1.19X90 (BLADE) ×1 IMPLANT
BLADE SAW SGTL 13X75X1.27 (BLADE) ×1 IMPLANT
BNDG CMPR 9X6 STRL LF SNTH (GAUZE/BANDAGES/DRESSINGS) ×1
BNDG CMPR MED 10X6 ELC LF (GAUZE/BANDAGES/DRESSINGS)
BNDG CMPR MED 15X6 ELC VLCR LF (GAUZE/BANDAGES/DRESSINGS) ×1
BNDG CMPR STD VLCR NS LF 5.8X4 (GAUZE/BANDAGES/DRESSINGS)
BNDG ELASTIC 4X5.8 VLCR NS LF (GAUZE/BANDAGES/DRESSINGS) ×1 IMPLANT
BNDG ELASTIC 4X5.8 VLCR STR LF (GAUZE/BANDAGES/DRESSINGS) ×1 IMPLANT
BNDG ELASTIC 6X10 VLCR STRL LF (GAUZE/BANDAGES/DRESSINGS) ×1 IMPLANT
BNDG ELASTIC 6X15 VLCR STRL LF (GAUZE/BANDAGES/DRESSINGS) IMPLANT
BNDG ESMARK 6X9 LF (GAUZE/BANDAGES/DRESSINGS) ×1
BOWL SMART MIX CTS (DISPOSABLE) ×1 IMPLANT
BSPLAT TIB 5 CMNT ROT PLAT STR (Knees) ×1 IMPLANT
CEMENT HV SMART SET (Cement) ×2 IMPLANT
COVER SURGICAL LIGHT HANDLE (MISCELLANEOUS) ×1 IMPLANT
CUFF TOURN SGL QUICK 34 (TOURNIQUET CUFF) ×1
CUFF TOURN SGL QUICK 42 (TOURNIQUET CUFF) IMPLANT
CUFF TRNQT CYL 34X4.125X (TOURNIQUET CUFF) ×1 IMPLANT
DRAPE ORTHO SPLIT 77X108 STRL (DRAPES) ×2
DRAPE SURG ORHT 6 SPLT 77X108 (DRAPES) ×2 IMPLANT
DRAPE U-SHAPE 47X51 STRL (DRAPES) ×1 IMPLANT
DURAPREP 26ML APPLICATOR (WOUND CARE) ×2 IMPLANT
ELECT REM PT RETURN 9FT ADLT (ELECTROSURGICAL) ×1
ELECTRODE REM PT RTRN 9FT ADLT (ELECTROSURGICAL) ×1 IMPLANT
FACESHIELD WRAPAROUND (MASK) ×2 IMPLANT
FACESHIELD WRAPAROUND OR TEAM (MASK) ×2 IMPLANT
GAUZE PAD ABD 8X10 STRL (GAUZE/BANDAGES/DRESSINGS) ×1 IMPLANT
GAUZE SPONGE 4X4 12PLY STRL (GAUZE/BANDAGES/DRESSINGS) IMPLANT
GAUZE XEROFORM 5X9 LF (GAUZE/BANDAGES/DRESSINGS) ×1 IMPLANT
GLOVE BIOGEL PI IND STRL 8 (GLOVE) ×2 IMPLANT
GLOVE ORTHO TXT STRL SZ7.5 (GLOVE) ×2 IMPLANT
GOWN STRL REUS W/ TWL LRG LVL3 (GOWN DISPOSABLE) ×1 IMPLANT
GOWN STRL REUS W/ TWL XL LVL3 (GOWN DISPOSABLE) ×1 IMPLANT
GOWN STRL REUS W/TWL 2XL LVL3 (GOWN DISPOSABLE) ×1 IMPLANT
GOWN STRL REUS W/TWL LRG LVL3 (GOWN DISPOSABLE) ×1
GOWN STRL REUS W/TWL XL LVL3 (GOWN DISPOSABLE) ×1
HANDPIECE INTERPULSE COAX TIP (DISPOSABLE) ×1
IMMOBILIZER KNEE 22 UNIV (SOFTGOODS) ×1 IMPLANT
KIT BASIN OR (CUSTOM PROCEDURE TRAY) ×1 IMPLANT
KIT TURNOVER KIT B (KITS) ×1 IMPLANT
MANIFOLD NEPTUNE II (INSTRUMENTS) ×1 IMPLANT
MARKER SKIN DUAL TIP RULER LAB (MISCELLANEOUS) ×1 IMPLANT
NDL 18GX1X1/2 (RX/OR ONLY) (NEEDLE) ×1 IMPLANT
NDL HYPO 25GX1X1/2 BEV (NEEDLE) ×1 IMPLANT
NEEDLE 18GX1X1/2 (RX/OR ONLY) (NEEDLE) IMPLANT
NEEDLE HYPO 25GX1X1/2 BEV (NEEDLE) ×1 IMPLANT
NS IRRIG 1000ML POUR BTL (IV SOLUTION) ×1 IMPLANT
PACK TOTAL JOINT (CUSTOM PROCEDURE TRAY) ×1 IMPLANT
PAD ARMBOARD 7.5X6 YLW CONV (MISCELLANEOUS) ×2 IMPLANT
PADDING CAST COTTON 6X4 STRL (CAST SUPPLIES) ×1 IMPLANT
PATELLA MEDIAL ATTUN 35MM KNEE (Knees) IMPLANT
SET HNDPC FAN SPRY TIP SCT (DISPOSABLE) ×1 IMPLANT
STAPLER VISISTAT 35W (STAPLE) IMPLANT
STRIP CLOSURE SKIN 1/2X4 (GAUZE/BANDAGES/DRESSINGS) IMPLANT
SUCTION FRAZIER HANDLE 10FR (MISCELLANEOUS) ×1
SUCTION TUBE FRAZIER 10FR DISP (MISCELLANEOUS) ×1 IMPLANT
SUT MNCRL AB 3-0 PS2 18 (SUTURE) IMPLANT
SUT VIC AB 0 CT1 27 (SUTURE) ×1
SUT VIC AB 0 CT1 27XBRD ANBCTR (SUTURE) ×1 IMPLANT
SUT VIC AB 1 CTX 36 (SUTURE) ×2
SUT VIC AB 1 CTX36XBRD ANBCTR (SUTURE) ×2 IMPLANT
SUT VIC AB 2-0 CT1 27 (SUTURE) ×2
SUT VIC AB 2-0 CT1 TAPERPNT 27 (SUTURE) ×2 IMPLANT
SYR 50ML LL SCALE MARK (SYRINGE) ×1 IMPLANT
SYR CONTROL 10ML LL (SYRINGE) ×1 IMPLANT
TIBIAL BASE ROT PLAT SZ 5 KNEE (Knees) ×1 IMPLANT
TOWEL GREEN STERILE (TOWEL DISPOSABLE) ×1 IMPLANT
TOWEL GREEN STERILE FF (TOWEL DISPOSABLE) ×1 IMPLANT
TRAY CATH 16FR W/PLASTIC CATH (SET/KITS/TRAYS/PACK) IMPLANT

## 2022-07-25 NOTE — Anesthesia Postprocedure Evaluation (Signed)
Anesthesia Post Note  Patient: Annalena Brennon Ulatowski  Procedure(s) Performed: LEFT TOTAL KNEE ARTHROPLASTY (Left: Knee)     Patient location during evaluation: PACU Anesthesia Type: Spinal Level of consciousness: oriented and awake and alert Pain management: pain level controlled Vital Signs Assessment: post-procedure vital signs reviewed and stable Respiratory status: spontaneous breathing, respiratory function stable and patient connected to nasal cannula oxygen Cardiovascular status: blood pressure returned to baseline and stable Postop Assessment: no headache, no backache and no apparent nausea or vomiting Anesthetic complications: no  No notable events documented.  Last Vitals:  Vitals:   07/25/22 1600 07/25/22 1615  BP: 138/62 123/64  Pulse: 66 68  Resp: (!) 22 12  Temp:    SpO2: 95% 92%    Last Pain:  Vitals:   07/25/22 1600  PainSc: 6     LLE Motor Response: Purposeful movement (07/25/22 1615) LLE Sensation: Full sensation (07/25/22 1615) RLE Motor Response: Purposeful movement (07/25/22 1615) RLE Sensation: Full sensation (07/25/22 1615) L Sensory Level: S1-Sole of foot, small toes (07/25/22 1615) R Sensory Level: S1-Sole of foot, small toes (07/25/22 1615)  Lamisha Roussell S

## 2022-07-25 NOTE — Plan of Care (Signed)
  Problem: Activity: Goal: Range of joint motion will improve Outcome: Progressing   Problem: Clinical Measurements: Goal: Postoperative complications will be avoided or minimized Outcome: Progressing   Problem: Pain Management: Goal: Pain level will decrease with appropriate interventions Outcome: Progressing   

## 2022-07-25 NOTE — Progress Notes (Signed)
PT Cancellation Note  Patient Details Name: Taylor Cohen MRN: [id number] DOB: 10-30-1955   Cancelled Treatment:    Reason Eval/Treat Not Completed: Other (comment). Pt eating dinner.   [place] 07/25/2022, [place] Office [phone]

## 2022-07-25 NOTE — Anesthesia Preprocedure Evaluation (Signed)
Anesthesia Evaluation  Patient identified by MRN, date of birth, ID band Patient awake    Reviewed: Allergy & Precautions, H&P , NPO status , Patient's Chart, lab work & pertinent test results  Airway Mallampati: II  TM Distance: >3 FB Neck ROM: Full    Dental no notable dental hx.    Pulmonary neg pulmonary ROS   Pulmonary exam normal breath sounds clear to auscultation       Cardiovascular hypertension, Normal cardiovascular exam Rhythm:Regular Rate:Normal     Neuro/Psych negative neurological ROS  negative psych ROS   GI/Hepatic negative GI ROS, Neg liver ROS,,,  Endo/Other  diabetes, Type 2    Renal/GU negative Renal ROS  negative genitourinary   Musculoskeletal  (+) Arthritis , Osteoarthritis,    Abdominal   Peds negative pediatric ROS (+)  Hematology negative hematology ROS (+)   Anesthesia Other Findings   Reproductive/Obstetrics negative OB ROS                             Anesthesia Physical Anesthesia Plan  ASA: 2  Anesthesia Plan: Spinal   Post-op Pain Management: Regional block*   Induction: Intravenous  PONV Risk Score and Plan: 2 and Ondansetron, Dexamethasone, Propofol infusion and Treatment may vary due to age or medical condition  Airway Management Planned: Simple Face Mask  Additional Equipment:   Intra-op Plan:   Post-operative Plan:   Informed Consent: I have reviewed the patients History and Physical, chart, labs and discussed the procedure including the risks, benefits and alternatives for the proposed anesthesia with the patient or authorized representative who has indicated his/her understanding and acceptance.     Dental advisory given  Plan Discussed with: CRNA and Surgeon  Anesthesia Plan Comments:        Anesthesia Quick Evaluation

## 2022-07-25 NOTE — Transfer of Care (Signed)
Immediate Anesthesia Transfer of Care Note  Patient: Taylor Cohen  Procedure(s) Performed: LEFT TOTAL KNEE ARTHROPLASTY (Left: Knee)  Patient Location: PACU  Anesthesia Type:Regional and Spinal  Level of Consciousness: awake, alert , and oriented  Airway & Oxygen Therapy: Patient Spontanous Breathing  Post-op Assessment: Report given to RN, Post -op Vital signs reviewed and stable, and Patient moving all extremities  Post vital signs: Reviewed and stable  Last Vitals:  Vitals Value Taken Time  BP 132/82   Temp    Pulse 67   Resp 14   SpO2 97     Last Pain:  Vitals:   07/25/22 1112  PainSc: 0-No pain         Complications: No notable events documented.

## 2022-07-25 NOTE — Progress Notes (Signed)
Orthopedic Tech Progress Note Patient Details:  Taylor Cohen 09-Apr-1956 [id number]  CPM Left Knee CPM Left Knee: On Left Knee Flexion (Degrees): 90 Left Knee Extension (Degrees): 0 I applied the cpm Post Interventions Patient Tolerated: Well Instructions Provided: Care of device, Adjustment of device  Karolee Stamps 07/25/2022, 8:37 PM

## 2022-07-25 NOTE — Anesthesia Procedure Notes (Signed)
Date/Time: 07/25/2022 1:15 PM  Performed by: Michele Rockers, CRNAPre-anesthesia Checklist: Patient identified, Emergency Drugs available, Suction available, Timeout performed and Patient being monitored Patient Re-evaluated:Patient Re-evaluated prior to induction Oxygen Delivery Method: Simple face mask

## 2022-07-25 NOTE — Anesthesia Procedure Notes (Signed)
Anesthesia Regional Block: Adductor canal block   Pre-Anesthetic Checklist: , timeout performed,  Correct Patient, Correct Site, Correct Laterality,  Correct Procedure, Correct Position, site marked,  Risks and benefits discussed,  Surgical consent,  Pre-op evaluation,  At surgeon's request and post-op pain management  Laterality: Left  Prep: chloraprep       Needles:  Injection technique: Single-shot  Needle Type: Echogenic Needle     Needle Length: 9cm      Additional Needles:   Procedures:,,,, ultrasound used (permanent image in chart),,    Narrative:  Start time: 07/25/2022 12:26 PM End time: 07/25/2022 12:33 PM Injection made incrementally with aspirations every 5 mL.  Performed by: Personally  Anesthesiologist: Myrtie Soman, MD  Additional Notes: Patient tolerated the procedure well without complications

## 2022-07-25 NOTE — Op Note (Signed)
Pre and postop diagnosis: Left knee primary osteoarthritis  Procedure: Left total knee arthroplasty.  Surgeon: Rodell Perna, MD  Assistant: Wandra Arthurs, RNFA.  Anesthesia preoperative adductor block spinal plus Exparel and Marcaine 20+20  Implants:Implants  CEMENT HV SMART SET - [id number]  Inventory Item: CEMENT HV SMART SET Serial no.: Model/Cat no.: [id number]  Implant name: CEMENT HV SMART SET - [id number] Laterality: Left Area: Knee  Manufacturer: [employer] Date of Manufacture:   Action: Implanted Number Used: 2   Device Identifier: Device Identifier Type:   ATTUNE PS FEM LT SZ 5 CEM KNEE - [id number]  Inventory Item: ATTUNE PS FEM LT SZ 5 CEM KNEE Serial no.: Model/Cat no.: [id number]  Implant name: ATTUNE PS FEM LT SZ 5 CEM KNEE - [id number] Laterality: Left Area: Knee  Manufacturer: [place] Date of Manufacture:   Action: Implanted Number Used: 1   Device Identifier: Device Identifier Type:   TIBIAL BASE ROT PLAT SZ 5 KNEE - [id number]  Inventory Item: TIBIAL BASE ROT PLAT SZ 5 KNEE Serial no.: Model/Cat no.: [id number]  Implant name: TIBIAL BASE ROT PLAT SZ 5 KNEE - [id number] Laterality: Left Area: Knee  Manufacturer: [employer] Date of Manufacture:   Action: Implanted Number Used: 1   Device Identifier: Device Identifier Type:   PATELLA MEDIAL ATTUN 35MM KNEE - [id number]  Inventory Item: PATELLA MEDIAL ATTUN 35MM KNEE Serial no.: Model/Cat no.: [id number]  Implant name: PATELLA MEDIAL ATTUN 35MM KNEE - [id number] Laterality: Left Area: Knee  Manufacturer: [employer] Date of Manufacture:   Action: Implanted Number Used: 1   Device Identifier: Device Identifier Type:   ATTUNE PSRP INSR SZ5 6 KNEE - [id number]   Inventory Item: ATTUNE PSRP INSR SZ5 6 KNEE Serial no.: Model/Cat no.: [id number]  Implant name: ATTUNE PSRP INSR SZ5 6 KNEE - [id number] Laterality: Left Area: Knee  Manufacturer: [place] Date of Manufacture:    Action: Implanted Number Used: 1   Device Identifier: Device Identifier Type:   Procedure:  After induction of spinal anesthesia patient was placed in the supine position proximal thigh tourniquet lateral post heel bump prepped with DuraPrep from the tourniquet to the tip of the toes usual total knee sheets draped sterile skin marker Betadine Steri-Drape impervious stockinette and Coban were all applied.  Timeout procedure was completed IV TXA and Ancef prophylaxis.  Leg was wrapped with an Esmarch after timeout procedure before tourniquet inflation at 300.  Tourniquet time was a total of 40 minutes.  Midline incision was made medial parapatellar incision patella was everted 10 mm resected hole drilled into the femur after meniscal remnants were resected there was bone-on-bone changes medial compartment and patellofemoral compartment with relative sparing of the lateral compartment.  Intramedullary hole drilled in the femur.  Using the guide distal cut 10 mm resected size for a #5.  Chamfer cuts box cut keel preparation size 5 on the tibia as well and 10 mm resected on the tibial side.  5 mm spacer block allowed full extension and once we put trials in place a 6 mm spacer allowed full extension but no hyperextension.  Pulse lavage drilling of the lug holes in the femur and patella was a 35 mm patella.  Thank you mixing of the cement.  Tibia cemented cemented first all excessive cement was removed followed by femur permanent 6 mm rotating platform.  All excessive cement was removed and patella was held with a patellar clamp.  After 15 minutes Marcaine Exparel was injected total of 40 cc  of one-to-one mixture Marcaine to Exparel.  Tourniquet deflated after 15 minutes hemostasis and then standard layered closure #1 Vicryl in the deep retinaculum quad tendon that was split between the medial one third lateral two thirds.  Subtenons tissue reapproximated with Monocryl subcuticular closure.  Tincture benzoin  Steri-Strips postop dressing and knee immobilizer.

## 2022-07-25 NOTE — Anesthesia Procedure Notes (Signed)
Anesthesia Procedure Image    

## 2022-07-25 NOTE — Interval H&P Note (Signed)
History and Physical Interval Note:  07/25/2022 11:58 AM  Taylor Cohen  has presented today for surgery, with the diagnosis of left knee osteoarthritis.  The various methods of treatment have been discussed with the patient and family. After consideration of risks, benefits and other options for treatment, the patient has consented to  Procedure(s) with comments: LEFT TOTAL KNEE ARTHROPLASTY (Left) - Needs RNFA as a surgical intervention.  The patient's history has been reviewed, patient examined, no change in status, stable for surgery.  I have reviewed the patient's chart and labs.  Questions were answered to the patient's satisfaction.     Marybelle Killings

## 2022-07-25 NOTE — Anesthesia Procedure Notes (Signed)
Spinal  Patient location during procedure: OR Start time: 07/25/2022 1:27 PM End time: 07/25/2022 1:31 PM Reason for block: surgical anesthesia Staffing Performed: anesthesiologist  Anesthesiologist: Myrtie Soman, MD Performed by: Myrtie Soman, MD Authorized by: Myrtie Soman, MD   Preanesthetic Checklist Completed: patient identified, IV checked, site marked, risks and benefits discussed, surgical consent, monitors and equipment checked, pre-op evaluation and timeout performed Spinal Block Patient position: sitting Prep: Betadine Patient monitoring: heart rate, continuous pulse ox and blood pressure Approach: midline Location: L3-4 Injection technique: single-shot Needle Needle type: Sprotte  Needle gauge: 24 G Needle length: 9 cm Assessment Sensory level: T6 Events: CSF return Additional Notes

## 2022-07-26 ENCOUNTER — Other Ambulatory Visit: Payer: Self-pay

## 2022-07-26 ENCOUNTER — Encounter (HOSPITAL_COMMUNITY): Payer: Self-pay | Admitting: Orthopaedic Surgery

## 2022-07-26 DIAGNOSIS — Z7984 Long term (current) use of oral hypoglycemic drugs: Secondary | ICD-10-CM | POA: Diagnosis not present

## 2022-07-26 DIAGNOSIS — Z96642 Presence of left artificial hip joint: Secondary | ICD-10-CM | POA: Diagnosis not present

## 2022-07-26 DIAGNOSIS — J45909 Unspecified asthma, uncomplicated: Secondary | ICD-10-CM | POA: Diagnosis not present

## 2022-07-26 DIAGNOSIS — M1712 Unilateral primary osteoarthritis, left knee: Secondary | ICD-10-CM | POA: Diagnosis not present

## 2022-07-26 DIAGNOSIS — Z853 Personal history of malignant neoplasm of breast: Secondary | ICD-10-CM | POA: Diagnosis not present

## 2022-07-26 DIAGNOSIS — I10 Essential (primary) hypertension: Secondary | ICD-10-CM | POA: Diagnosis not present

## 2022-07-26 DIAGNOSIS — Z79899 Other long term (current) drug therapy: Secondary | ICD-10-CM | POA: Diagnosis not present

## 2022-07-26 LAB — GLUCOSE, CAPILLARY: Glucose-Capillary: 180 mg/dL — ABNORMAL HIGH (ref 70–99)

## 2022-07-26 MED ORDER — OXYCODONE-ACETAMINOPHEN 5-325 MG PO TABS: 1.0000 | ORAL_TABLET | Freq: Four times a day (QID) | ORAL | 0 refills | Status: DC | PRN

## 2022-07-26 MED ORDER — ASPIRIN 325 MG PO TABS: 325.0000 mg | ORAL_TABLET | Freq: Every day | ORAL | Status: AC

## 2022-07-26 MED ORDER — METAXALONE 800 MG PO TABS: 800.0000 mg | ORAL_TABLET | Freq: Three times a day (TID) | ORAL | 1 refills | Status: AC | PRN

## 2022-07-26 NOTE — Discharge Instructions (Signed)

## 2022-07-26 NOTE — Evaluation (Signed)
Physical Therapy Evaluation Patient Details Name: Taylor Cohen MRN: [id number] DOB: Aug 21, 1955 Today's Date: 07/26/2022  History of Present Illness  Pt is 67 year old presented to [facility] on  3/4 for lt TKR. PMH - HTN, DM, asthma, breast CA, lt THR  Clinical Impression  Pt admitted with above diagnosis, s/p Lt tka 3/4. Able to ambulate at min guard level 50 feet and 60 feet respectively. Safely completed stair training with son's assistance. Son is an EMT and will be assisting patient at home.  Pt currently with functional limitations due to the deficits listed below (see PT Problem List). Pt will benefit from skilled PT to increase their independence and safety with mobility to allow discharge to the venue listed below.          Recommendations for follow up therapy are one component of a multi-disciplinary discharge planning process, led by the attending physician.  Recommendations may be updated based on patient status, additional functional criteria and insurance authorization.  Follow Up Recommendations Follow physician's recommendations for discharge plan and follow up therapies      Assistance Recommended at Discharge Intermittent Supervision/Assistance  Patient can return home with the following  A little help with walking and/or transfers;A little help with bathing/dressing/bathroom;Assistance with cooking/housework;Help with stairs or ramp for entrance;Assist for transportation    Equipment Recommendations None recommended by PT (Has RW at home)  Recommendations for Other Services       Functional Status Assessment Patient has had a recent decline in their functional status and demonstrates the ability to make significant improvements in function in a reasonable and predictable amount of time.     Precautions / Restrictions Precautions Precautions: Knee Precaution Booklet Issued: Yes (comment) Restrictions Weight Bearing Restrictions: Yes LLE Weight Bearing: Weight  bearing as tolerated      Mobility  Bed Mobility               General bed mobility comments: In recliner    Transfers Overall transfer level: Needs assistance Equipment used: Rolling walker (2 wheels) Transfers: Sit to/from Stand Sit to Stand: Min assist           General transfer comment: Min assist for boost to stand. Cues for technique. Limited WB tolerance with LLE initially but improved with UE support on RW upon standing.    Ambulation/Gait Ambulation/Gait assistance: Min guard Gait Distance (Feet): 50 Feet (+60) Assistive device: Rolling walker (2 wheels) Gait Pattern/deviations: Step-through pattern, Step-to pattern, Decreased stride length, Antalgic Gait velocity: decr Gait velocity interpretation: <1.31 ft/sec, indicative of household ambulator   General Gait Details: Educated on safe AD use with RW. Progressed from step-to, to step-through gait pattern. Cues for sequencing, min guard for safety. Pt vomited on first bout, but wanted to keep walking. Second bout without vomiting. No buckling noted.  Stairs Stairs: Yes Stairs assistance: Min assist Stair Management: No rails, Step to pattern, Backwards, With walker Number of Stairs: 3 General stair comments: Educated pt and son on stair navigation without rail, using RW, similar to home set-up. Posterior approach. Son blocked RW. Pt able to verbalize sequencing after training. Both feel confident with technique.  Wheelchair Mobility    Modified Rankin (Stroke Patients Only)       Balance Overall balance assessment: Mild deficits observed, not formally tested (with RW for support today.)  Pertinent Vitals/Pain Pain Assessment Pain Assessment: Faces Faces Pain Scale: Hurts little more Pain Location: Lt knee Pain Descriptors / Indicators: Aching Pain Intervention(s): Monitored during session, Repositioned, Premedicated before session     Home Living Family/patient expects to be discharged to:: Private residence Living Arrangements: Children Available Help at Discharge: Family;Available 24 hours/day Type of Home: House Home Access: Stairs to enter Entrance Stairs-Rails: None (wall pt holds onto) Entrance Stairs-Number of Steps: 3   Home Layout: One level Home Equipment: Cane - single Barista (2 wheels) Additional Comments: Lives with son who plans to take off work to be home first few days    Prior Function Prior Level of Function : Independent/Modified Independent             Mobility Comments: Mod indep with SPC ADLs Comments: Typically mod indep; sometimes has difficulty reaching feet     Hand Dominance   Dominant Hand: Right    Extremity/Trunk Assessment   Upper Extremity Assessment Upper Extremity Assessment: Defer to OT evaluation    Lower Extremity Assessment Lower Extremity Assessment: LLE deficits/detail LLE Deficits / Details: Pain and weakness as expected post-op. LLE: Unable to fully assess due to pain;Unable to fully assess due to immobilization (bandaging)       Communication   Communication: Expressive difficulties  Cognition Arousal/Alertness: Awake/alert Behavior During Therapy: WFL for tasks assessed/performed Overall Cognitive Status: Within Functional Limits for tasks assessed                                          General Comments General comments (skin integrity, edema, etc.): BP 129/68. Heavy perspiration. Received dilaudid prior to tx, pt vomited, felt better.    Exercises Total Joint Exercises Ankle Circles/Pumps: AROM, Both, 10 reps, Seated Quad Sets: Strengthening, Both, 10 reps, Seated Gluteal Sets: Strengthening, Both, 10 reps, Seated   Assessment/Plan    PT Assessment Patient needs continued PT services  PT Problem List Decreased strength;Decreased range of motion;Decreased activity tolerance;Decreased balance;Decreased  mobility;Decreased knowledge of use of DME;Decreased knowledge of precautions;Pain       PT Treatment Interventions DME instruction;Gait training;Stair training;Functional mobility training;Therapeutic activities;Therapeutic exercise;Balance training;Neuromuscular re-education;Patient/family education    PT Goals (Current goals can be found in the Care Plan section)  Acute Rehab PT Goals Patient Stated Goal: Get well PT Goal Formulation: With patient Time For Goal Achievement: 08/02/22 Potential to Achieve Goals: Good    Frequency 7X/week     Co-evaluation               AM-PAC PT "6 Clicks" Mobility  Outcome Measure Help needed turning from your back to your side while in a flat bed without using bedrails?: None Help needed moving from lying on your back to sitting on the side of a flat bed without using bedrails?: A Little Help needed moving to and from a bed to a chair (including a wheelchair)?: A Little Help needed standing up from a chair using your arms (e.g., wheelchair or bedside chair)?: A Little Help needed to walk in hospital room?: A Little Help needed climbing 3-5 steps with a railing? : A Little 6 Click Score: 19    End of Session Equipment Utilized During Treatment: Gait belt Activity Tolerance: Patient tolerated treatment well Patient left: in chair;with call bell/phone within reach;with chair alarm set;with family/visitor present;with SCD's reapplied Nurse Communication: Mobility status PT Visit Diagnosis: Other abnormalities  of gait and mobility (R26.89);Difficulty in walking, not elsewhere classified (R26.2);Pain Pain - Right/Left: Left Pain - part of body: Knee    Time: [id number] PT Time Calculation (min) (ACUTE ONLY): 35 min   Charges:   PT Evaluation $PT Eval Low Complexity: 1 Low PT Treatments $Gait Training: 8-22 mins        Candie Mile, PT, DPT Physical Therapist Acute Rehabilitation Services [place]   Ellouise Newer 07/26/2022, 10:03 AM

## 2022-07-26 NOTE — Care Plan (Signed)
Ortho Bundle Case Management Note  Patient Details  Name: Taylor Cohen MRN: [id number] Date of Birth: 07/16/1955  Patient seen in office during her H&P appointment for her upcoming Left total knee arthroplasty with Dr. Lorin Mercy on 07/25/22. She is an Ortho bundle patient through [facility] and is agreeable to case management. She will be going to her son's home after discharge so that he can provide assistance. Address is : [address] [place], [place]. She has a RW as well as a cane. No other DME needed. Anticipate HHPT will be needed after a short hospital stay. Referral made to [facility] after choice provided. Reviewed all post op care instructions with copy provided for patient's review at home. Will continue to follow for needs.                   DME Arranged:   (Patient has RW and cane at home already from previous Orthopedic surgery) DME Agency:     [place]:  PT [place]:  [place]  Additional Comments: Please contact me with any questions of if this plan should need to change.  Jamse Arn, RN, BSN, [employer]  [phone] 07/26/2022, 9:07 AM

## 2022-07-26 NOTE — Care Management Obs Status (Signed)
[place] NOTIFICATION   Patient Details  Name: Taylor Cohen MRN: [id number] Date of Birth: Jul 15, 1955   Medicare Observation Status Notification Given:  Yes    Carles Collet, RN 07/26/2022, 2:29 PM

## 2022-07-26 NOTE — Progress Notes (Signed)
Patient ID: Taylor Cohen, female   DOB: 02-22-1956, 67 y.o.   MRN: [id number]   Subjective: 1 Day Post-Op Procedure(s) (LRB): LEFT TOTAL KNEE ARTHROPLASTY (Left) Patient reports pain as mild and moderate.    Objective: Vital signs in last 24 hours: Temp:  [97.6 F (36.4 C)-98.1 F (36.7 C)] 98.1 F (36.7 C) (03/05 0743) Pulse Rate:  [55-72] 72 (03/05 0743) Resp:  [12-22] 18 (03/05 0502) BP: (102-156)/(58-101) 102/64 (03/05 0743) SpO2:  [88 %-100 %] 97 % (03/05 0743) Weight:  [78 kg] 78 kg (03/04 1112)  Intake/Output from previous day: 03/04 0701 - 03/05 0700 In: 2725 [I.V.:2525; IV Piggyback:200] Out: 1025 [Urine:1000; Blood:25] Intake/Output this shift: No intake/output data recorded.  No results for input(s): "HGB" in the last 72 hours. No results for input(s): "WBC", "RBC", "HCT", "PLT" in the last 72 hours. No results for input(s): "NA", "K", "CL", "CO2", "BUN", "CREATININE", "GLUCOSE", "CALCIUM" in the last 72 hours. No results for input(s): "LABPT", "INR" in the last 72 hours.  Neurologically intact No results found.  Assessment/Plan: 1 Day Post-Op Procedure(s) (LRB): LEFT TOTAL KNEE ARTHROPLASTY (Left) Up with therapy.  Has 2 to 3 steps to get enter home.   Marybelle Killings 07/26/2022, 7:59 AM

## 2022-07-26 NOTE — Progress Notes (Signed)
Occupational Therapy Evaluation Patient Details Name: Taylor Cohen MRN: [id number] DOB: [birth year] Today's Date: 07/26/2022   History of Present Illness Pt is 67 year old presented to [facility] on  3/4 for lt TKR. PMH - HTN, DM, asthma, breast CA, lt THR   Clinical Impression   Pt. Is s/p LKA, states pain 10/10 but exhibits 4/10 pain with faces scale, seems to be minimally limited in mobility by pain.  Pt. Requires some assistance with LB ADLs due to unable to bend down to reach feet while seated to perform bathing or dressing.  Pt lives with son who is an EMT, son is taking 1 month off work to assist mother.  Pt would benefit from continued OT services to instruct on use of reacher/sock aide to assist with LB dressing, and precautions with bathing and using BSC as shower seat, Pt has BSC at home from prior hip surgery. Pt motivated to participate and although was nauseous at beginning of session and in pain, Pt completed all tasks and tolerated well.   Recommendations for follow up therapy are one component of a multi-disciplinary discharge planning process, led by the attending physician.  Recommendations may be updated based on patient status, additional functional criteria and insurance authorization.   Follow Up Recommendations  No OT follow up     Assistance Recommended at Discharge Set up Supervision/Assistance  Patient can return home with the following A little help with walking and/or transfers;A little help with bathing/dressing/bathroom;Assistance with cooking/housework;Assist for transportation;Help with stairs or ramp for entrance    Functional Status Assessment  Patient has had a recent decline in their functional status and demonstrates the ability to make significant improvements in function in a reasonable and predictable amount of time.  Equipment Recommendations  Other (comment) (sock aide)    Recommendations for Other Services       Precautions / Restrictions  Precautions Precautions: Knee Restrictions Weight Bearing Restrictions: Yes LLE Weight Bearing: Weight bearing as tolerated      Mobility Bed Mobility Overal bed mobility: Needs Assistance Bed Mobility: Sit to Supine, Supine to Sit     Supine to sit: Min assist Sit to supine: Min assist   General bed mobility comments: Pt. requires support of LLE with bed mobility    Transfers Overall transfer level: Needs assistance Equipment used: Rolling walker (2 wheels) Transfers: Sit to/from Stand Sit to Stand: Min assist                  Balance Overall balance assessment: Mild deficits observed, not formally tested                                         ADL either performed or assessed with clinical judgement   ADL Overall ADL's : Needs assistance/impaired Eating/Feeding: Independent   Grooming: Set up   Upper Body Bathing: Modified independent Upper Body Bathing Details (indicate cue type and reason): requires shower seat Lower Body Bathing: Moderate assistance Lower Body Bathing Details (indicate cue type and reason): unable to bend down to touch past knees, unable to cross L leg to reach feet Upper Body Dressing : Set up   Lower Body Dressing: Moderate assistance Lower Body Dressing Details (indicate cue type and reason): able to stand with min A, able to balance unsupported while standing to don pants, requires help donning LLE and reaching down to don Toilet Transfer:  Minimal assistance   Toileting- Clothing Manipulation and Hygiene: Moderate assistance Toileting - Clothing Manipulation Details (indicate cue type and reason): unable to bend down and reach past knees Tub/ Shower Transfer: Minimal assistance   Functional mobility during ADLs: Min guard General ADL Comments: Pt. requires LB ADL assistance, unable to bend down and reach past knees while seated, stands/ambulates min guard. and RW     Vision         Perception     Praxis       Pertinent Vitals/Pain Pain Assessment Pain Assessment: 0-10 Pain Score: 10-Worst pain ever Faces Pain Scale: Hurts little more Pain Location: Lt knee Pain Descriptors / Indicators: Aching Pain Intervention(s): Monitored during session, Repositioned, Premedicated before session     Hand Dominance Right   Extremity/Trunk Assessment Upper Extremity Assessment Upper Extremity Assessment: Overall WFL for tasks assessed   Lower Extremity Assessment Lower Extremity Assessment: Defer to PT evaluation       Communication Communication Communication: No difficulties (Pt. mumbled but was able to follow directions, answer questions, express wants, needs, and concerns.)   Cognition Arousal/Alertness: Awake/alert Behavior During Therapy: WFL for tasks assessed/performed Overall Cognitive Status: Within Functional Limits for tasks assessed                                       General Comments  Pt. states 10/10 pain, 4/10 pain observed during activities. Pt nauseous at beginning of session, but subsided as session went on.    Exercises     Shoulder Instructions      Home Living Family/patient expects to be discharged to:: Private residence Living Arrangements: Children Available Help at Discharge: Family;Available 24 hours/day Type of Home: House Home Access: Stairs to enter [employer] of Steps: 3 Entrance Stairs-Rails: None (wall pt holds onto) Home Layout: One level     Bathroom Shower/Tub: Occupational psychologist: Standard     Home Equipment: Cane - single Barista (2 wheels)   Additional Comments: Lives with son who plans to take off work to be home first few days      Prior Functioning/Environment Prior Level of Function : Independent/Modified Independent             Mobility Comments: Mod indep with SPC ADLs Comments: Typically mod indep; sometimes has difficulty reaching feet        OT Problem List:  Decreased range of motion;Decreased activity tolerance;Pain;Impaired vision/perception      OT Treatment/Interventions: Self-care/ADL training;Therapeutic exercise;Therapeutic activities;Patient/family education;DME and/or AE instruction    OT Goals(Current goals can be found in the care plan section) Acute Rehab OT Goals Patient Stated Goal: To return home and return to PLOF OT Goal Formulation: With patient Time For Goal Achievement: 08/09/22 Potential to Achieve Goals: Good  OT Frequency: Min 2X/week    Co-evaluation              AM-PAC OT "6 Clicks" Daily Activity     Outcome Measure Help from another person eating meals?: None Help from another person taking care of personal grooming?: A Little Help from another person toileting, which includes using toliet, bedpan, or urinal?: A Lot Help from another person bathing (including washing, rinsing, drying)?: A Lot Help from another person to put on and taking off regular upper body clothing?: A Little Help from another person to put on and taking off regular lower body clothing?: A  Lot 6 Click Score: 16   End of Session Equipment Utilized During Treatment: Rolling walker (2 wheels);Gait belt Nurse Communication: Mobility status  Activity Tolerance: Patient tolerated treatment well Patient left: in bed;with call bell/phone within reach;with nursing/sitter in room;with family/visitor present  OT Visit Diagnosis: Unsteadiness on feet (R26.81);Pain;Other abnormalities of gait and mobility (R26.89) Pain - Right/Left: Left Pain - part of body: Knee                Time: [id number] OT Time Calculation (min): 30 min Charges:  OT General Charges $OT Visit: 1 Visit OT Evaluation $OT Eval Low Complexity: 1 Low OT Treatments $Self Care/Home Management : 8-22 mins  [place] 07/26/2022, 12:47 PM

## 2022-07-26 NOTE — Progress Notes (Signed)
Physical Therapy Treatment Patient Details Name: Taylor Cohen MRN: [id number] DOB: 1955-12-24 Today's Date: 07/26/2022   History of Present Illness Pt is 67 year old presented to [facility] on  3/4 for lt TKR. PMH - HTN, DM, asthma, breast CA, lt THR    PT Comments    Great progress today. Tolerated treatment well, focusing on increased ambulatory distance an symmetry of gait. Supervision for safety with light RW use for support. No buckling noted. Min guard with bed mobility and transfers from bed. Son present during session. Adequate for d/c from PT standpoint. Reviewed precautions, positioning, modalities, and HEP. All questions answered. Will follow until d/c.    Recommendations for follow up therapy are one component of a multi-disciplinary discharge planning process, led by the attending physician.  Recommendations may be updated based on patient status, additional functional criteria and insurance authorization.  Follow Up Recommendations  Follow physician's recommendations for discharge plan and follow up therapies     Assistance Recommended at Discharge Intermittent Supervision/Assistance  Patient can return home with the following A little help with walking and/or transfers;A little help with bathing/dressing/bathroom;Assistance with cooking/housework;Help with stairs or ramp for entrance;Assist for transportation   Equipment Recommendations  None recommended by PT (Has RW at home)    Recommendations for Other Services       Precautions / Restrictions Precautions Precautions: Knee Precaution Booklet Issued: Yes (comment) Precaution Comments: reviewed Restrictions Weight Bearing Restrictions: Yes LLE Weight Bearing: Weight bearing as tolerated     Mobility  Bed Mobility Overal bed mobility: Needs Assistance Bed Mobility: Supine to Sit     Supine to sit: Min guard     General bed mobility comments: Min guard for safety, slow to move LLE but capable without  physical help.    Transfers Overall transfer level: Needs assistance Equipment used: Rolling walker (2 wheels) Transfers: Sit to/from Stand Sit to Stand: Min guard           General transfer comment: Min guard for safety. Able to boost up slowly, cues for hand placement. Performed from low bed setting, minimal WB through LLE initially.    Ambulation/Gait Ambulation/Gait assistance: Supervision Gait Distance (Feet): 105 Feet Assistive device: Rolling walker (2 wheels) Gait Pattern/deviations: Step-through pattern, Decreased stride length, Antalgic Gait velocity: decr Gait velocity interpretation: <1.31 ft/sec, indicative of household ambulator   General Gait Details: Progression of gait symmetry and distance. Supervision for safety, slow and guarded, mildly antalgic but without evidence of buckling or overt LOB while using RW for light support. Cues throughout as needed.   Stairs         General stair comments: Declines, feels comfortable   Wheelchair Mobility    Modified Rankin (Stroke Patients Only)       Balance Overall balance assessment: Mild deficits observed, not formally tested (with RW for support today.)                                          Cognition Arousal/Alertness: Awake/alert Behavior During Therapy: WFL for tasks assessed/performed Overall Cognitive Status: Within Functional Limits for tasks assessed                                          Exercises Total Joint Exercises Ankle Circles/Pumps: AROM, Both, 10  reps, Seated Quad Sets: Strengthening, Both, 10 reps, Seated Short Arc Quad: Strengthening, Left, 5 reps, Seated Heel Slides: AAROM, Left, 5 reps, Seated Hip ABduction/ADduction: Strengthening, Left, 10 reps, Seated Straight Leg Raises: AAROM, Strengthening, Left, 5 reps, Seated Long Arc Quad: Strengthening, Left, 5 reps, Seated Knee Flexion: AROM, Left, 5 reps, Seated Goniometric ROM: 4-83 deg Lt  knee flexion AROM    General Comments General comments (skin integrity, edema, etc.): Son present, supportive      Pertinent Vitals/Pain Pain Assessment Pain Assessment: Faces Faces Pain Scale: Hurts even more Pain Location: Lt knee Pain Descriptors / Indicators: Aching Pain Intervention(s): Monitored during session, Limited activity within patient's tolerance, Repositioned, Ice applied    Home Living                          Prior Function            PT Goals (current goals can now be found in the care plan section) Acute Rehab PT Goals Patient Stated Goal: Get well PT Goal Formulation: With patient Time For Goal Achievement: 08/02/22 Potential to Achieve Goals: Good Progress towards PT goals: Progressing toward goals    Frequency    7X/week      PT Plan Current plan remains appropriate    Co-evaluation              AM-PAC PT "6 Clicks" Mobility   Outcome Measure  Help needed turning from your back to your side while in a flat bed without using bedrails?: None Help needed moving from lying on your back to sitting on the side of a flat bed without using bedrails?: A Little Help needed moving to and from a bed to a chair (including a wheelchair)?: A Little Help needed standing up from a chair using your arms (e.g., wheelchair or bedside chair)?: A Little Help needed to walk in hospital room?: A Little Help needed climbing 3-5 steps with a railing? : A Little 6 Click Score: 19    End of Session Equipment Utilized During Treatment: Gait belt Activity Tolerance: Patient tolerated treatment well Patient left: in chair;with call bell/phone within reach;with chair alarm set;with family/visitor present;with SCD's reapplied Nurse Communication: Mobility status PT Visit Diagnosis: Other abnormalities of gait and mobility (R26.89);Difficulty in walking, not elsewhere classified (R26.2);Pain Pain - Right/Left: Left Pain - part of body: Knee     Time:  [id number] PT Time Calculation (min) (ACUTE ONLY): 25 min  Charges:  $Gait Training: 8-22 mins $Therapeutic Exercise: 8-22 mins                     Candie Mile, PT, DPT Physical Therapist Acute Rehabilitation Services [place]    Ellouise Newer 07/26/2022, 4:03 PM

## 2022-07-27 ENCOUNTER — Other Ambulatory Visit: Payer: Self-pay | Admitting: Orthopaedic Surgery

## 2022-07-27 ENCOUNTER — Telehealth: Payer: Self-pay | Admitting: Radiology

## 2022-07-27 ENCOUNTER — Telehealth: Payer: Self-pay | Admitting: *Deleted

## 2022-07-27 DIAGNOSIS — Z96652 Presence of left artificial knee joint: Secondary | ICD-10-CM | POA: Diagnosis not present

## 2022-07-27 DIAGNOSIS — Z471 Aftercare following joint replacement surgery: Secondary | ICD-10-CM | POA: Diagnosis not present

## 2022-07-27 DIAGNOSIS — I10 Essential (primary) hypertension: Secondary | ICD-10-CM | POA: Diagnosis not present

## 2022-07-27 DIAGNOSIS — J45909 Unspecified asthma, uncomplicated: Secondary | ICD-10-CM | POA: Diagnosis not present

## 2022-07-27 DIAGNOSIS — Z7982 Long term (current) use of aspirin: Secondary | ICD-10-CM | POA: Diagnosis not present

## 2022-07-27 DIAGNOSIS — Z853 Personal history of malignant neoplasm of breast: Secondary | ICD-10-CM | POA: Diagnosis not present

## 2022-07-27 DIAGNOSIS — Z7984 Long term (current) use of oral hypoglycemic drugs: Secondary | ICD-10-CM | POA: Diagnosis not present

## 2022-07-27 DIAGNOSIS — N200 Calculus of kidney: Secondary | ICD-10-CM | POA: Diagnosis not present

## 2022-07-27 DIAGNOSIS — R1310 Dysphagia, unspecified: Secondary | ICD-10-CM | POA: Diagnosis not present

## 2022-07-27 DIAGNOSIS — K219 Gastro-esophageal reflux disease without esophagitis: Secondary | ICD-10-CM | POA: Diagnosis not present

## 2022-07-27 DIAGNOSIS — E785 Hyperlipidemia, unspecified: Secondary | ICD-10-CM | POA: Diagnosis not present

## 2022-07-27 DIAGNOSIS — E119 Type 2 diabetes mellitus without complications: Secondary | ICD-10-CM | POA: Diagnosis not present

## 2022-07-27 NOTE — Discharge Summary (Signed)
Physician Discharge Summary  Patient ID: Taylor Cohen MRN: [id number] DOB/AGE: 1955/10/11 67 y.o.  Admit date: 07/25/2022 Discharge date: 07/27/2022  Admission Diagnoses: Left knee primary osteoarthritis  Discharge Diagnoses: Same Principal Problem:   S/P total knee arthroplasty   Discharged Condition: good  Hospital Course: Patient was admitted after failed conservative treatment for knee osteoarthritis.  She underwent total knee arthroplasty and postop PT OT CPM use.  She is amatory with a walker safely at the time of discharge.  Office follow-up 1 week.  Consults:  PT and OT  Significant Diagnostic Studies: labs: Routine admission labs were unremarkable.  Hemoglobin 13.8 preop.  Treatments: Left total knee arthroplasty procedure under spinal.  Discharge Exam: Blood pressure 116/60, pulse 83, temperature 97.8 F (36.6 C), temperature source Oral, resp. rate 18, height '5\' 4"'$  (1.626 m), weight 78 kg, SpO2 92 %. New dressing was applied Mepilex prior to discharge.  Disposition: Discharge disposition: 01-Home or Self Care      Home physical therapy was arranged and after home PT for few weeks she will transition to outpatient therapy.  Allergies as of 07/26/2022       Reactions   Lisinopril Swelling   Other Hives   Ace inhaler        Medication List     STOP taking these medications    acetaminophen 500 MG tablet Commonly known as: TYLENOL       TAKE these medications    albuterol (2.5 MG/3ML) 0.083% nebulizer solution Commonly known as: PROVENTIL Take 2.5 mg by nebulization every 6 (six) hours as needed for wheezing or shortness of breath.   albuterol 108 (90 Base) MCG/ACT inhaler Commonly known as: VENTOLIN HFA Inhale 1-2 puffs into the lungs every 6 (six) hours as needed for wheezing or shortness of breath.   amLODipine 10 MG tablet Commonly known as: NORVASC Take 10 mg by mouth in the morning.   aspirin 325 MG tablet Commonly known as:  Bayer Aspirin Take 1 tablet (325 mg total) by mouth daily. Take one daily for 4 wks to help prevent blood clots then stop   bismuth subsalicylate 99991111 [id number] suspension Commonly known as: PEPTO BISMOL Take 30 mLs by mouth every 6 (six) hours as needed for indigestion.   CALCIUM 600+D3 PO Take 1 tablet by mouth in the morning.   citalopram 20 MG tablet Commonly known as: CELEXA Take 20 mg by mouth in the morning.   cycloSPORINE 0.05 % ophthalmic emulsion Commonly known as: RESTASIS Place 1 drop into both eyes 2 (two) times daily.   Farxiga 10 MG Tabs tablet Generic drug: dapagliflozin propanediol Take 10 mg by mouth every morning.   fexofenadine 180 MG tablet Commonly known as: ALLEGRA Take 180 mg by mouth daily as needed for allergies or rhinitis.   glipiZIDE 5 MG 24 hr tablet Commonly known as: GLUCOTROL XL Take 5 mg by mouth daily.   hydrochlorothiazide 25 MG tablet Commonly known as: HYDRODIURIL Take 25 mg by mouth in the morning.   metaxalone 800 MG tablet Commonly known as: Skelaxin Take 1 tablet (800 mg total) by mouth every 8 (eight) hours as needed for muscle spasms.   metFORMIN 1000 MG tablet Commonly known as: GLUCOPHAGE Take 1,000 mg by mouth every morning.   multivitamin tablet Take 1 tablet by mouth daily. Woman   omega-3 acid ethyl esters 1 g capsule Commonly known as: LOVAZA Take 1 g by mouth 2 (two) times daily.   oxyCODONE-acetaminophen 5-325 MG tablet Commonly  known as: Percocet Take 1-2 tablets by mouth every 6 (six) hours as needed for severe pain.   pravastatin 40 MG tablet Commonly known as: PRAVACHOL Take 40 mg by mouth daily.        Follow-up Information     Marybelle Killings, MD. Go on 08/02/2022.   Specialty: Orthopedic Surgery Why: at 2:15 pm in the [facility] office for your 1 week post op appointment. Contact information: [place] [address] [phone]         [facility] Follow up.    Why: Someone from the home health agency will be in contact with you to arrange your first in home physical therapy visit                Signed: Marybelle Killings 07/27/2022, 8:10 AM

## 2022-07-27 NOTE — Telephone Encounter (Signed)
Attempted D/C call to patient. No answer and left VM.

## 2022-07-27 NOTE — Telephone Encounter (Signed)
Taylor Cohen statiing pt pain level today is 9/10 but stable. Pt took the pain meds when got home, pts bp is elevated with readings of 144/71,152/72,150/69,159/67, and 149/67 within a 2 hr time frame while with pt. Pt has no signs of distress, pt has bandage on and wants to know if can take off.   There is a severity 1 medication potential interaction between Citalopram 20 mg and metaxalone '800mg'$  tabs.   Requests call back regarding this. [phone]

## 2022-07-27 NOTE — Telephone Encounter (Signed)
Submitted prior authorization for Metaxalone '800mg'$  through Cover My Meds.   Lillyauna Crespi (KeyBeatriz Stallion) PA Case ID #: [id number] Rx #: [id number] Need Help? Call us at [phone] Status sent iconSent to Plan today Drug Metaxalone '800MG'$  tablets ePA cloud logo Form [employer] Medicare Part D Electronic Prior Authorization Form (2017 NCPDP) Original Claim Info [phone] Provide Exception Process Printed NoticeNon-Form, Dr. Delilah Shan ePA at [employer].comTIZANIDINE TABS;IBUPROFEN;FLURBIPROFEN;NAPROXEN;ETODOLAC PREF'D

## 2022-07-28 ENCOUNTER — Telehealth: Payer: Self-pay | Admitting: Orthopaedic Surgery

## 2022-07-28 ENCOUNTER — Telehealth: Payer: Self-pay

## 2022-07-28 MED ORDER — ONDANSETRON HCL 4 MG PO TABS: 4.0000 mg | ORAL_TABLET | Freq: Three times a day (TID) | ORAL | 0 refills | Status: AC | PRN

## 2022-07-28 NOTE — Telephone Encounter (Signed)
Patient called asked if she can get something for nausea? Patient said she uses [facility]. The number to contact patient is [phone]

## 2022-07-28 NOTE — Telephone Encounter (Signed)
[place] called stating that patient does not have a waterproof bandage under her ace wrap and was advised to just leave the ace wrap on.  CB# [phone] or can call patient [phone].  Please advise.  Thank you.

## 2022-07-28 NOTE — Telephone Encounter (Signed)
Please advise. Patient is status post TKA on 07/25/2022 and not scheduled to come back in to the office until 08/02/2022.  Would you like me to have her come in for nurse visit to place mepilex dressing?

## 2022-07-28 NOTE — Telephone Encounter (Signed)
Called in Zofran per your request.  Patient advised.

## 2022-07-28 NOTE — Telephone Encounter (Signed)
noted 

## 2022-07-29 DIAGNOSIS — R1310 Dysphagia, unspecified: Secondary | ICD-10-CM | POA: Diagnosis not present

## 2022-07-29 DIAGNOSIS — Z7982 Long term (current) use of aspirin: Secondary | ICD-10-CM | POA: Diagnosis not present

## 2022-07-29 DIAGNOSIS — E785 Hyperlipidemia, unspecified: Secondary | ICD-10-CM | POA: Diagnosis not present

## 2022-07-29 DIAGNOSIS — K219 Gastro-esophageal reflux disease without esophagitis: Secondary | ICD-10-CM | POA: Diagnosis not present

## 2022-07-29 DIAGNOSIS — Z853 Personal history of malignant neoplasm of breast: Secondary | ICD-10-CM | POA: Diagnosis not present

## 2022-07-29 DIAGNOSIS — J45909 Unspecified asthma, uncomplicated: Secondary | ICD-10-CM | POA: Diagnosis not present

## 2022-07-29 DIAGNOSIS — N200 Calculus of kidney: Secondary | ICD-10-CM | POA: Diagnosis not present

## 2022-07-29 DIAGNOSIS — E119 Type 2 diabetes mellitus without complications: Secondary | ICD-10-CM | POA: Diagnosis not present

## 2022-07-29 DIAGNOSIS — Z471 Aftercare following joint replacement surgery: Secondary | ICD-10-CM | POA: Diagnosis not present

## 2022-07-29 DIAGNOSIS — I10 Essential (primary) hypertension: Secondary | ICD-10-CM | POA: Diagnosis not present

## 2022-07-29 DIAGNOSIS — Z7984 Long term (current) use of oral hypoglycemic drugs: Secondary | ICD-10-CM | POA: Diagnosis not present

## 2022-07-29 DIAGNOSIS — Z96652 Presence of left artificial knee joint: Secondary | ICD-10-CM | POA: Diagnosis not present

## 2022-07-31 DIAGNOSIS — I10 Essential (primary) hypertension: Secondary | ICD-10-CM | POA: Diagnosis not present

## 2022-07-31 DIAGNOSIS — Z7984 Long term (current) use of oral hypoglycemic drugs: Secondary | ICD-10-CM | POA: Diagnosis not present

## 2022-07-31 DIAGNOSIS — Z471 Aftercare following joint replacement surgery: Secondary | ICD-10-CM | POA: Diagnosis not present

## 2022-07-31 DIAGNOSIS — E785 Hyperlipidemia, unspecified: Secondary | ICD-10-CM | POA: Diagnosis not present

## 2022-07-31 DIAGNOSIS — Z96652 Presence of left artificial knee joint: Secondary | ICD-10-CM | POA: Diagnosis not present

## 2022-07-31 DIAGNOSIS — Z7982 Long term (current) use of aspirin: Secondary | ICD-10-CM | POA: Diagnosis not present

## 2022-07-31 DIAGNOSIS — K219 Gastro-esophageal reflux disease without esophagitis: Secondary | ICD-10-CM | POA: Diagnosis not present

## 2022-07-31 DIAGNOSIS — Z853 Personal history of malignant neoplasm of breast: Secondary | ICD-10-CM | POA: Diagnosis not present

## 2022-07-31 DIAGNOSIS — J45909 Unspecified asthma, uncomplicated: Secondary | ICD-10-CM | POA: Diagnosis not present

## 2022-07-31 DIAGNOSIS — N200 Calculus of kidney: Secondary | ICD-10-CM | POA: Diagnosis not present

## 2022-07-31 DIAGNOSIS — R1310 Dysphagia, unspecified: Secondary | ICD-10-CM | POA: Diagnosis not present

## 2022-07-31 DIAGNOSIS — E119 Type 2 diabetes mellitus without complications: Secondary | ICD-10-CM | POA: Diagnosis not present

## 2022-08-01 DIAGNOSIS — Z471 Aftercare following joint replacement surgery: Secondary | ICD-10-CM | POA: Diagnosis not present

## 2022-08-01 DIAGNOSIS — K219 Gastro-esophageal reflux disease without esophagitis: Secondary | ICD-10-CM | POA: Diagnosis not present

## 2022-08-01 DIAGNOSIS — Z96652 Presence of left artificial knee joint: Secondary | ICD-10-CM | POA: Diagnosis not present

## 2022-08-01 DIAGNOSIS — E119 Type 2 diabetes mellitus without complications: Secondary | ICD-10-CM | POA: Diagnosis not present

## 2022-08-01 DIAGNOSIS — Z853 Personal history of malignant neoplasm of breast: Secondary | ICD-10-CM | POA: Diagnosis not present

## 2022-08-01 DIAGNOSIS — N200 Calculus of kidney: Secondary | ICD-10-CM | POA: Diagnosis not present

## 2022-08-01 DIAGNOSIS — E785 Hyperlipidemia, unspecified: Secondary | ICD-10-CM | POA: Diagnosis not present

## 2022-08-01 DIAGNOSIS — Z7982 Long term (current) use of aspirin: Secondary | ICD-10-CM | POA: Diagnosis not present

## 2022-08-01 DIAGNOSIS — J45909 Unspecified asthma, uncomplicated: Secondary | ICD-10-CM | POA: Diagnosis not present

## 2022-08-01 DIAGNOSIS — I10 Essential (primary) hypertension: Secondary | ICD-10-CM | POA: Diagnosis not present

## 2022-08-01 DIAGNOSIS — R1310 Dysphagia, unspecified: Secondary | ICD-10-CM | POA: Diagnosis not present

## 2022-08-01 DIAGNOSIS — Z7984 Long term (current) use of oral hypoglycemic drugs: Secondary | ICD-10-CM | POA: Diagnosis not present

## 2022-08-02 ENCOUNTER — Ambulatory Visit (INDEPENDENT_AMBULATORY_CARE_PROVIDER_SITE_OTHER): Payer: 59 | Admitting: Orthopaedic Surgery

## 2022-08-02 ENCOUNTER — Ambulatory Visit (INDEPENDENT_AMBULATORY_CARE_PROVIDER_SITE_OTHER): Payer: 59

## 2022-08-02 VITALS — BP 128/64 | HR 92 | Ht 64.0 in | Wt 172.0 lb

## 2022-08-02 DIAGNOSIS — Z96652 Presence of left artificial knee joint: Secondary | ICD-10-CM | POA: Diagnosis not present

## 2022-08-02 MED ORDER — OXYCODONE-ACETAMINOPHEN 5-325 MG PO TABS: 1.0000 | ORAL_TABLET | Freq: Four times a day (QID) | ORAL | 0 refills | Status: AC | PRN

## 2022-08-02 NOTE — Progress Notes (Signed)
Post-Op Visit Note   Patient: Taylor Cohen           Date of Birth: [birth year]           MRN: [id number] Visit Date: 08/02/2022 PCP: Neale Burly, MD   Assessment & Plan: Post total knee arthroplasty left knee.  Steri-Strips changed.  Range of motion is 5 to 83 degrees.  I gave her additional exercises to work on extension and leg lifting and cross ankle flexion.  Recheck 4 weeks.  She will start outpatient therapy in [place] next week on Monday.  Percocet renewed although she has some left she will need the prescription likely before the end of next week.  Chief Complaint: No chief complaint on file.  Visit Diagnoses:  1. Status post left knee replacement   2. Status post total left knee replacement     Plan: Continue home health physical therapy this week start outpatient physical therapy next week recheck 4 weeks in [place] clinic.  No x-Selvy needed on return visit.  Follow-Up Instructions: Return in about 4 weeks (around 08/30/2022).   Orders:  Orders Placed This Encounter  Procedures   XR Knee 1-2 Views Left   Meds ordered this encounter  Medications   oxyCODONE-acetaminophen (PERCOCET) 5-325 MG tablet    Sig: Take 1-2 tablets by mouth every 6 (six) hours as needed for severe pain.    Dispense:  40 tablet    Refill:  0    Post op pain    Imaging: XR Knee 1-2 Views Left  Result Date: 08/02/2022 Standing AP both knees lateral left knee obtained and reviewed this shows well-positioned left total knee arthroplasty in satisfactory position and alignment.  Moderate arthritis changes opposite knee noted again. Impression: Satisfactory left total knee arthroplasty.   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 Left; s/p mastectomy  and chemo   Depression    Diabetes ([place])    Type 2   Dyspnea    GERD (gastroesophageal reflux disease)    History of kidney stones    HLD (hyperlipidemia)    HTN (hypertension)    Pneumonia    Hx    Family History  Problem Relation Age of Onset   Colon cancer Neg Hx    Stomach cancer Neg Hx    Esophageal cancer Neg Hx     Past Surgical History:  Procedure Laterality Date   BALLOON DILATION N/A 03/02/2021   Procedure: BALLOON DILATION;  Surgeon: Eloise Harman, DO;  [facility];  Service: Endoscopy;  Laterality: N/A;   BIOPSY  03/02/2021   Procedure: BIOPSY;  Surgeon: Eloise Harman, DO;  [facility];  Service: Endoscopy;;   BREAST SURGERY     left mastectomy   COLONOSCOPY     [place]   ESOPHAGOGASTRODUODENOSCOPY (EGD) WITH PROPOFOL N/A 03/02/2021   Procedure: ESOPHAGOGASTRODUODENOSCOPY (EGD) WITH PROPOFOL;  Surgeon: Eloise Harman, DO;  [facility];  Service: Endoscopy;  Laterality: N/A;  10:00am   MASTECTOMY Left    2004 or 2005   TOTAL HIP ARTHROPLASTY Left 08/18/2021   Procedure: Left TOTAL HIP ARTHROPLASTY ANTERIOR APPROACH;  Surgeon: Marybelle Killings, MD;  Location: [facility];  Service: Orthopedics;  Laterality: Left;   TOTAL KNEE ARTHROPLASTY Left 07/25/2022   Procedure: LEFT TOTAL KNEE ARTHROPLASTY;  Surgeon: Marybelle Killings, MD;  Location: [facility];  Service: Orthopedics;  Laterality: Left;  Needs RNFA   TUBAL LIGATION     Social History   Occupational History   Not on file  Tobacco Use   Smoking status: Never   Smokeless tobacco: Never  Vaping Use   Vaping Use: Never used  Substance and Sexual Activity   Alcohol use: Never   Drug use: Never   Sexual activity: Yes

## 2022-08-03 DIAGNOSIS — J45909 Unspecified asthma, uncomplicated: Secondary | ICD-10-CM | POA: Diagnosis not present

## 2022-08-03 DIAGNOSIS — I10 Essential (primary) hypertension: Secondary | ICD-10-CM | POA: Diagnosis not present

## 2022-08-03 DIAGNOSIS — Z471 Aftercare following joint replacement surgery: Secondary | ICD-10-CM | POA: Diagnosis not present

## 2022-08-03 DIAGNOSIS — Z7982 Long term (current) use of aspirin: Secondary | ICD-10-CM | POA: Diagnosis not present

## 2022-08-03 DIAGNOSIS — Z96652 Presence of left artificial knee joint: Secondary | ICD-10-CM | POA: Diagnosis not present

## 2022-08-03 DIAGNOSIS — R1310 Dysphagia, unspecified: Secondary | ICD-10-CM | POA: Diagnosis not present

## 2022-08-03 DIAGNOSIS — Z853 Personal history of malignant neoplasm of breast: Secondary | ICD-10-CM | POA: Diagnosis not present

## 2022-08-03 DIAGNOSIS — Z7984 Long term (current) use of oral hypoglycemic drugs: Secondary | ICD-10-CM | POA: Diagnosis not present

## 2022-08-03 DIAGNOSIS — E119 Type 2 diabetes mellitus without complications: Secondary | ICD-10-CM | POA: Diagnosis not present

## 2022-08-03 DIAGNOSIS — E785 Hyperlipidemia, unspecified: Secondary | ICD-10-CM | POA: Diagnosis not present

## 2022-08-03 DIAGNOSIS — K219 Gastro-esophageal reflux disease without esophagitis: Secondary | ICD-10-CM | POA: Diagnosis not present

## 2022-08-03 DIAGNOSIS — N200 Calculus of kidney: Secondary | ICD-10-CM | POA: Diagnosis not present

## 2022-08-05 ENCOUNTER — Telehealth: Payer: Self-pay | Admitting: *Deleted

## 2022-08-05 NOTE — Telephone Encounter (Signed)
Met with patient on 08/02/22 in office for 1 week post op with Dr. Lorin Mercy. Arranged OPPT. No other needs.

## 2022-08-08 DIAGNOSIS — M1712 Unilateral primary osteoarthritis, left knee: Secondary | ICD-10-CM | POA: Diagnosis not present

## 2022-08-09 DIAGNOSIS — M1712 Unilateral primary osteoarthritis, left knee: Secondary | ICD-10-CM | POA: Diagnosis not present

## 2022-08-12 DIAGNOSIS — M1712 Unilateral primary osteoarthritis, left knee: Secondary | ICD-10-CM | POA: Diagnosis not present

## 2022-08-15 DIAGNOSIS — M1712 Unilateral primary osteoarthritis, left knee: Secondary | ICD-10-CM | POA: Diagnosis not present

## 2022-08-17 DIAGNOSIS — M1712 Unilateral primary osteoarthritis, left knee: Secondary | ICD-10-CM | POA: Diagnosis not present

## 2022-08-22 DIAGNOSIS — M1712 Unilateral primary osteoarthritis, left knee: Secondary | ICD-10-CM | POA: Diagnosis not present

## 2022-08-24 ENCOUNTER — Ambulatory Visit (INDEPENDENT_AMBULATORY_CARE_PROVIDER_SITE_OTHER): Payer: 59 | Admitting: Orthopaedic Surgery

## 2022-08-24 ENCOUNTER — Encounter: Payer: Self-pay | Admitting: Orthopaedic Surgery

## 2022-08-24 ENCOUNTER — Other Ambulatory Visit: Payer: Self-pay

## 2022-08-24 VITALS — Ht 64.0 in | Wt 172.0 lb

## 2022-08-24 DIAGNOSIS — M25562 Pain in left knee: Secondary | ICD-10-CM

## 2022-08-24 DIAGNOSIS — M1712 Unilateral primary osteoarthritis, left knee: Secondary | ICD-10-CM | POA: Diagnosis not present

## 2022-08-25 NOTE — Progress Notes (Signed)
Post-Op Visit Note   Patient: Taylor Cohen           Date of Birth: Apr 07, 1956           MRN: [id number] Visit Date: 08/24/2022 PCP: Neale Burly, MD   Assessment & Plan: 67 year old female is doing well with PT until just few days ago and suddenly instead of flexing almost to 90 she only got 50 degrees with extreme pain adjacent to the patella on the medial side over the medial femoral condyle close to where plate could be present.  The therapist that she needs to be reevaluated she not really been taking a lot of pain medication.  She does have some swelling.  Knee aspiration 30 cc serosanguineous dark fluid without clots.  Straight Marcaine injected and then we got her to about 80 degrees.  She will continue with therapy.  Follow-up as already scheduled.  Chief Complaint:  Chief Complaint  Patient presents with   Left Knee - Pain    07/25/2022 Left TKA   Visit Diagnoses:  1. Acute pain of left knee     Plan: Return as planned  Follow-Up Instructions: No follow-ups on file.   Orders:  Orders Placed This Encounter  Procedures   XR Knee 1-2 Views Left   No orders of the defined types were placed in this encounter.   Imaging: No results found.  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 Left; s/p mastectomy and chemo   Depression    Diabetes    Type 2   Dyspnea    GERD (gastroesophageal reflux disease)    History of kidney stones    HLD (hyperlipidemia)    HTN (hypertension)    Pneumonia    Hx    Family History  Problem Relation Age of Onset   Colon cancer Neg Hx    Stomach cancer Neg Hx    Esophageal cancer Neg Hx     Past Surgical History:  Procedure Laterality Date   BALLOON DILATION N/A 03/02/2021    Procedure: BALLOON DILATION;  Surgeon: Eloise Harman, DO;  [facility];  Service: Endoscopy;  Laterality: N/A;   BIOPSY  03/02/2021   Procedure: BIOPSY;  Surgeon: Eloise Harman, DO;  [facility];  Service: Endoscopy;;   BREAST SURGERY     left mastectomy   COLONOSCOPY     [facility]   ESOPHAGOGASTRODUODENOSCOPY (EGD) WITH PROPOFOL N/A 03/02/2021   Procedure: ESOPHAGOGASTRODUODENOSCOPY (EGD) WITH PROPOFOL;  Surgeon: Eloise Harman, DO;  [facility];  Service: Endoscopy;  Laterality: N/A;  10:00am   MASTECTOMY Left    2004 or 2005   TOTAL HIP ARTHROPLASTY Left 08/18/2021   Procedure: Left TOTAL HIP ARTHROPLASTY ANTERIOR APPROACH;  Surgeon: Marybelle Killings, MD;  Location: [facility];  Service: Orthopedics;  Laterality: Left;   TOTAL KNEE ARTHROPLASTY Left 07/25/2022   Procedure: LEFT TOTAL KNEE ARTHROPLASTY;  Surgeon: Marybelle Killings, MD;  Location: [facility];  Service: Orthopedics;  Laterality: Left;  Needs RNFA   TUBAL LIGATION     Social History   Occupational History   Not on file  Tobacco Use   Smoking status: Never   Smokeless  tobacco: Never  Vaping Use   Vaping Use: Never used  Substance and Sexual Activity   Alcohol use: Never   Drug use: Never   Sexual activity: Yes

## 2022-08-26 DIAGNOSIS — M1712 Unilateral primary osteoarthritis, left knee: Secondary | ICD-10-CM | POA: Diagnosis not present

## 2022-08-29 ENCOUNTER — Telehealth: Payer: Self-pay | Admitting: Radiology

## 2022-08-29 ENCOUNTER — Other Ambulatory Visit: Payer: Self-pay | Admitting: Orthopaedic Surgery

## 2022-08-29 DIAGNOSIS — M1712 Unilateral primary osteoarthritis, left knee: Secondary | ICD-10-CM | POA: Diagnosis not present

## 2022-08-29 NOTE — Telephone Encounter (Signed)
Please see message from [place] office below and advise.   She states the Oxycodone and tylenol she is taking is making her break out in hives.  Is there something else she can take?  She uses [facility] and her number is [phone].

## 2022-08-29 NOTE — Telephone Encounter (Signed)
noted 

## 2022-08-31 DIAGNOSIS — M1712 Unilateral primary osteoarthritis, left knee: Secondary | ICD-10-CM | POA: Diagnosis not present

## 2022-09-01 ENCOUNTER — Other Ambulatory Visit (INDEPENDENT_AMBULATORY_CARE_PROVIDER_SITE_OTHER): Payer: 59

## 2022-09-01 ENCOUNTER — Ambulatory Visit (INDEPENDENT_AMBULATORY_CARE_PROVIDER_SITE_OTHER): Payer: 59 | Admitting: Orthopaedic Surgery

## 2022-09-01 VITALS — Ht 64.0 in | Wt 172.0 lb

## 2022-09-01 DIAGNOSIS — M25552 Pain in left hip: Secondary | ICD-10-CM

## 2022-09-01 DIAGNOSIS — Z96652 Presence of left artificial knee joint: Secondary | ICD-10-CM

## 2022-09-01 NOTE — Progress Notes (Signed)
Post-Op Visit Note   Patient: Taylor Cohen           Date of Birth: 04/24/1956           MRN: [id number] Visit Date: 09/01/2022 PCP: Toma Deiters, MD   Assessment & Plan: Follow-up total knee arthroplasty.  Patient had Marcaine injection 08/24/2022 is now picked up flexion again like she had before the injection is flexion just past 90.  She will continue to work on extension and quad strengthening.  Incision looks good.  Recheck 4 weeks continue PT. patient was complaining of left groin pain.  Patient was complaining of left groin pain and had left hip direct anterior arthroplasty 08/18/2021 over a year ago.  X-rays show good position alignment of left hip and mild progression of the right hip arthritis.  Chief Complaint:  Chief Complaint  Patient presents with   Left Knee - Follow-up    07/25/2022 Left TKA   Left Hip - Pain    08/18/2021 Left THA   Visit Diagnoses:  1. Pain in left hip   2. Status post total left knee replacement     Plan: Return 4 weeks.  Follow-Up Instructions: Return in about 4 weeks (around 09/29/2022).   Orders:  Orders Placed This Encounter  Procedures   XR HIP UNILAT W OR W/O PELVIS 2-3 VIEWS LEFT   No orders of the defined types were placed in this encounter.   Imaging: XR HIP UNILAT W OR W/O PELVIS 2-3 VIEWS LEFT  Result Date: 09/01/2022 Standing AP both hips frog-leg left hip obtained and reviewed this shows well-positioned total hip arthroplasty comparison to 08/12/2021 images show no change.  Patient has had some mild progression of right hip osteoarthritis with joint space narrowing and subchondral sclerosis. Impression: Satisfactory left total hip arthroplasty no subsidence or loosening.   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 Left; s/p mastectomy and chemo   Depression    Diabetes    Type 2   Dyspnea    GERD (gastroesophageal reflux disease)    History of kidney stones    HLD (hyperlipidemia)    HTN (hypertension)    Pneumonia    Hx    Family History  Problem Relation Age of Onset   Colon cancer Neg Hx    Stomach cancer Neg Hx    Esophageal cancer Neg Hx     Past Surgical History:  Procedure Laterality Date   BALLOON DILATION N/A 03/02/2021   Procedure: BALLOON DILATION;  Surgeon: Lanelle Bal, DO;  [facility];  Service: Endoscopy;  Laterality: N/A;   BIOPSY  03/02/2021   Procedure: BIOPSY;  Surgeon: Lanelle Bal, DO;  [facility];  Service: Endoscopy;;   BREAST SURGERY     left mastectomy   COLONOSCOPY     [facility]   ESOPHAGOGASTRODUODENOSCOPY (EGD) WITH PROPOFOL N/A 03/02/2021   Procedure: ESOPHAGOGASTRODUODENOSCOPY (EGD) WITH PROPOFOL;  Surgeon: Lanelle Bal, DO;  [facility];  Service: Endoscopy;  Laterality: N/A;  10:00am   MASTECTOMY Left    2004 or 2005   TOTAL HIP ARTHROPLASTY Left 08/18/2021   Procedure: Left TOTAL HIP ARTHROPLASTY ANTERIOR APPROACH;  Surgeon: Ophelia Charter,  Veverly Fells, MD;  [facility];  Service: Orthopedics;  Laterality: Left;   TOTAL KNEE ARTHROPLASTY Left 07/25/2022   Procedure: LEFT TOTAL KNEE ARTHROPLASTY;  Surgeon: Eldred Manges, MD;  [facility];  Service: Orthopedics;  Laterality: Left;  Needs RNFA   TUBAL LIGATION     Social History   Occupational History   Not on file  Tobacco Use   Smoking status: Never   Smokeless tobacco: Never  Vaping Use   Vaping Use: Never used  Substance and Sexual Activity   Alcohol use: Never   Drug use: Never   Sexual activity: Yes

## 2022-09-05 DIAGNOSIS — M1712 Unilateral primary osteoarthritis, left knee: Secondary | ICD-10-CM | POA: Diagnosis not present

## 2022-09-07 DIAGNOSIS — M1712 Unilateral primary osteoarthritis, left knee: Secondary | ICD-10-CM | POA: Diagnosis not present

## 2022-09-12 DIAGNOSIS — M1712 Unilateral primary osteoarthritis, left knee: Secondary | ICD-10-CM | POA: Diagnosis not present

## 2022-09-14 DIAGNOSIS — M1712 Unilateral primary osteoarthritis, left knee: Secondary | ICD-10-CM | POA: Diagnosis not present

## 2022-09-19 DIAGNOSIS — M1712 Unilateral primary osteoarthritis, left knee: Secondary | ICD-10-CM | POA: Diagnosis not present

## 2022-09-21 DIAGNOSIS — M1712 Unilateral primary osteoarthritis, left knee: Secondary | ICD-10-CM | POA: Diagnosis not present

## 2022-09-26 DIAGNOSIS — J4530 Mild persistent asthma, uncomplicated: Secondary | ICD-10-CM | POA: Diagnosis not present

## 2022-09-26 DIAGNOSIS — M1712 Unilateral primary osteoarthritis, left knee: Secondary | ICD-10-CM | POA: Diagnosis not present

## 2022-09-26 DIAGNOSIS — E1169 Type 2 diabetes mellitus with other specified complication: Secondary | ICD-10-CM | POA: Diagnosis not present

## 2022-09-26 DIAGNOSIS — E782 Mixed hyperlipidemia: Secondary | ICD-10-CM | POA: Diagnosis not present

## 2022-09-26 DIAGNOSIS — Z0000 Encounter for general adult medical examination without abnormal findings: Secondary | ICD-10-CM | POA: Diagnosis not present

## 2022-09-26 DIAGNOSIS — I10 Essential (primary) hypertension: Secondary | ICD-10-CM | POA: Diagnosis not present

## 2022-09-26 IMAGING — DX DG HIP (WITH OR WITHOUT PELVIS) 1V PORT*L*
2 series · 2 of 2 positions shown · non-contrast
Comparison: None.

CLINICAL DATA: LEFT total hip arthroplasty

EXAM:
DG HIP (WITH OR WITHOUT PELVIS) 1V PORT LEFT

[pelvis]
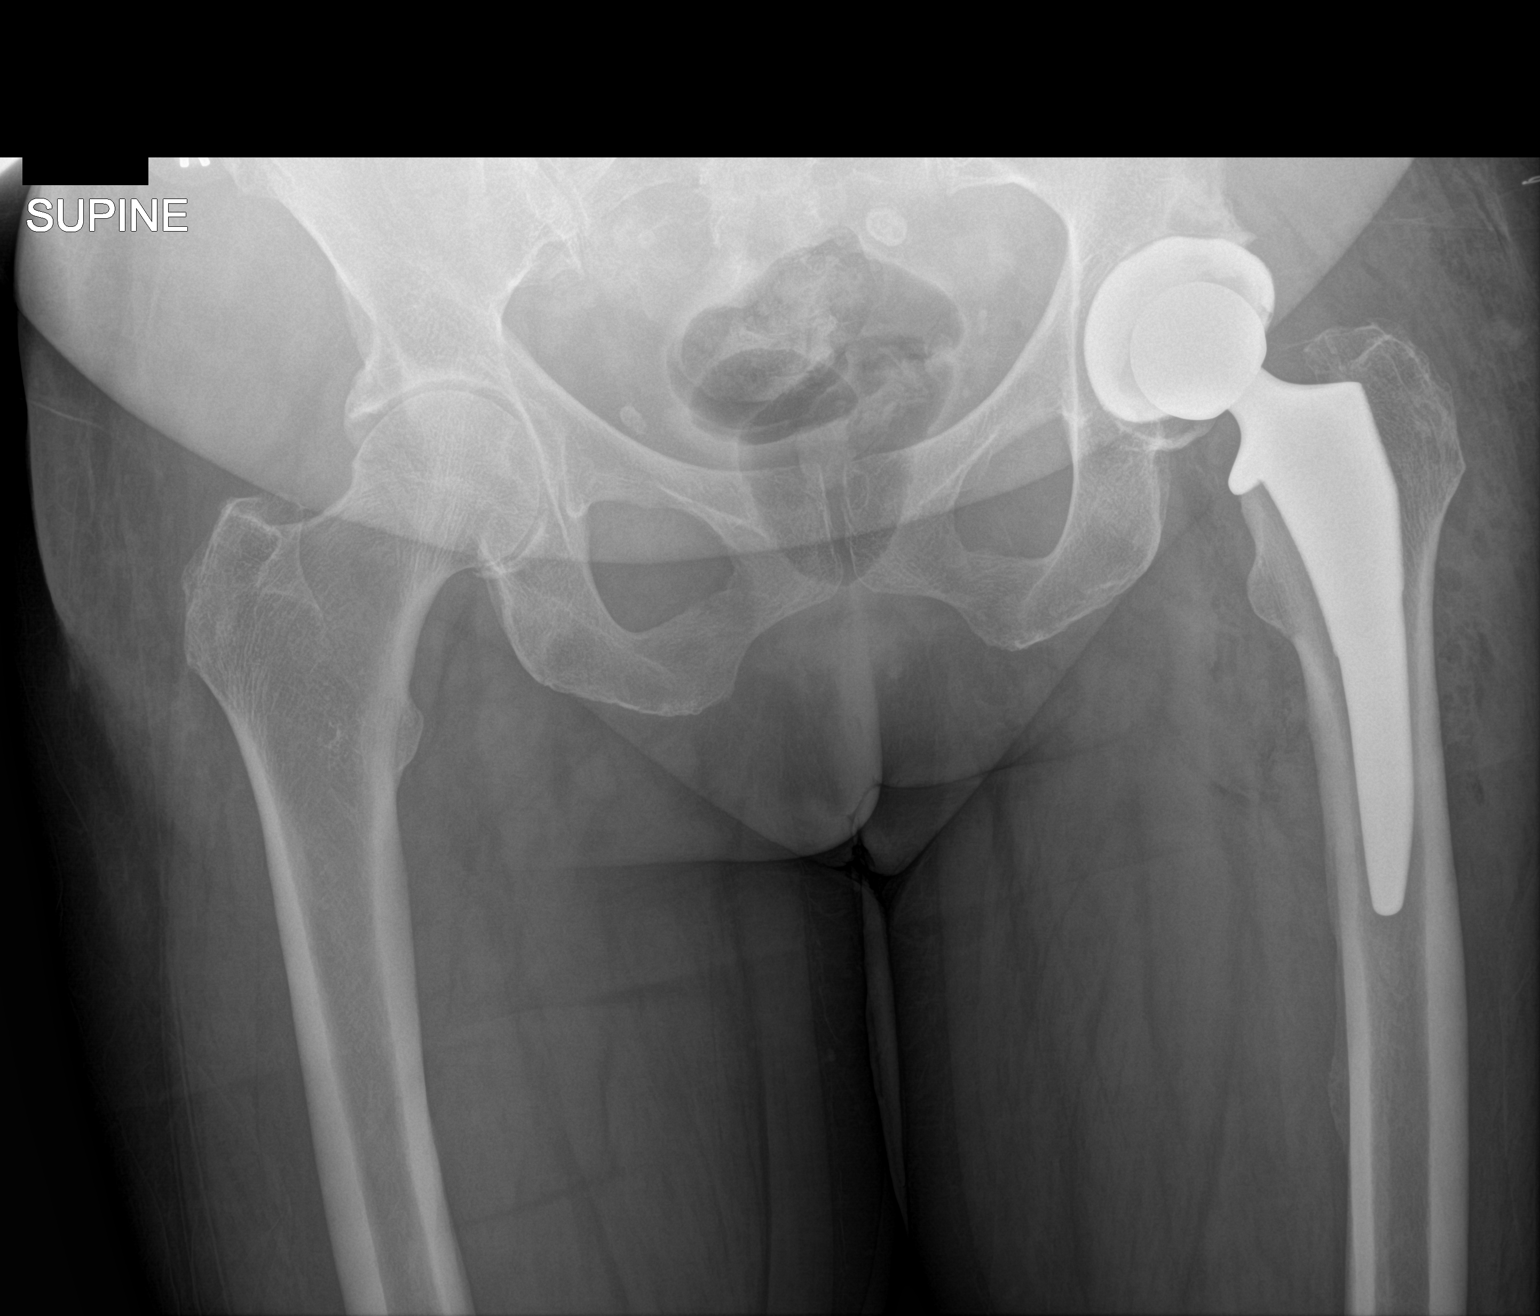

[hip]
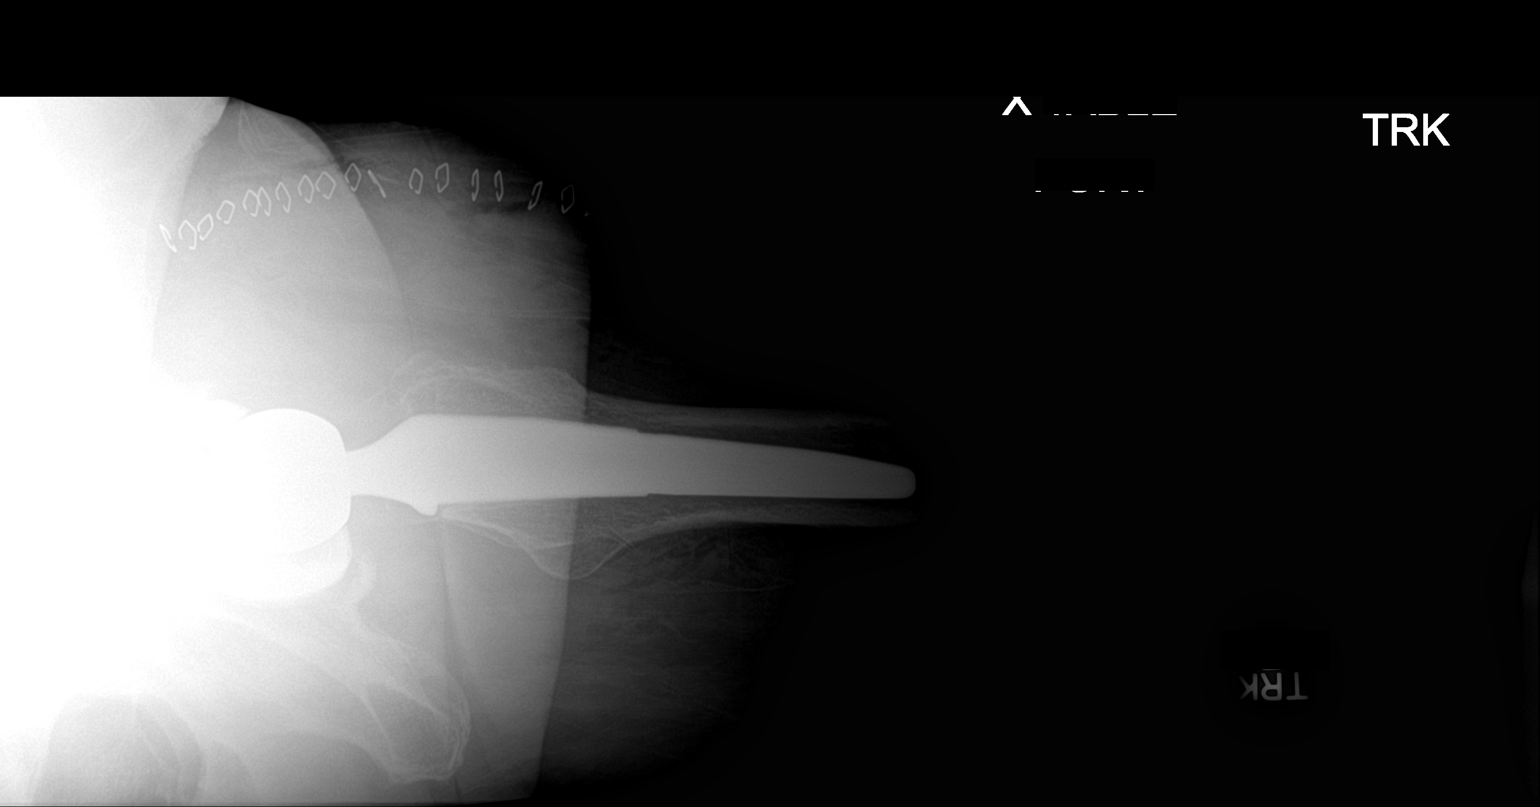

[2 of 2 positions shown; findings below may reference images not displayed]

FINDINGS: Total hip arthroplasty. Prosthetic components appear well seated. No
fracture. Expected postsurgical soft tissue changes.
IMPRESSION: Total hip arthroplasty without complication.

## 2022-09-26 IMAGING — RF DG HIP (WITH OR WITHOUT PELVIS) 1V*L*
1 series · 4 of 4 positions shown · non-contrast
Comparison: Left hip radiographs 06/24/2021

FLUOROSCOPY:
Fluoroscopy Time: 20 seconds

Radiation Exposure Index: 2.36 mGy

CLINICAL DATA: Left total hip arthroplasty.

EXAM:
DG HIP (WITH OR WITHOUT PELVIS) 1V*L*

[Series 1: dg x-ray · 0.20mm/px · 4 of 4 slices shown]
[im 1/4]
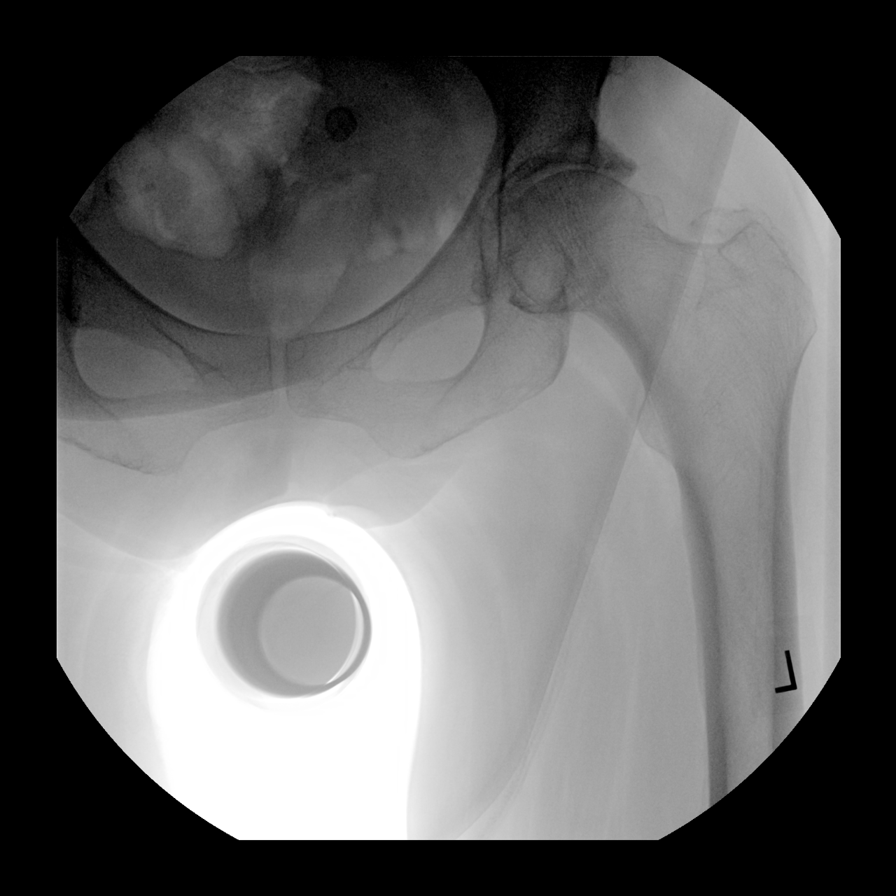
[im 2/4]
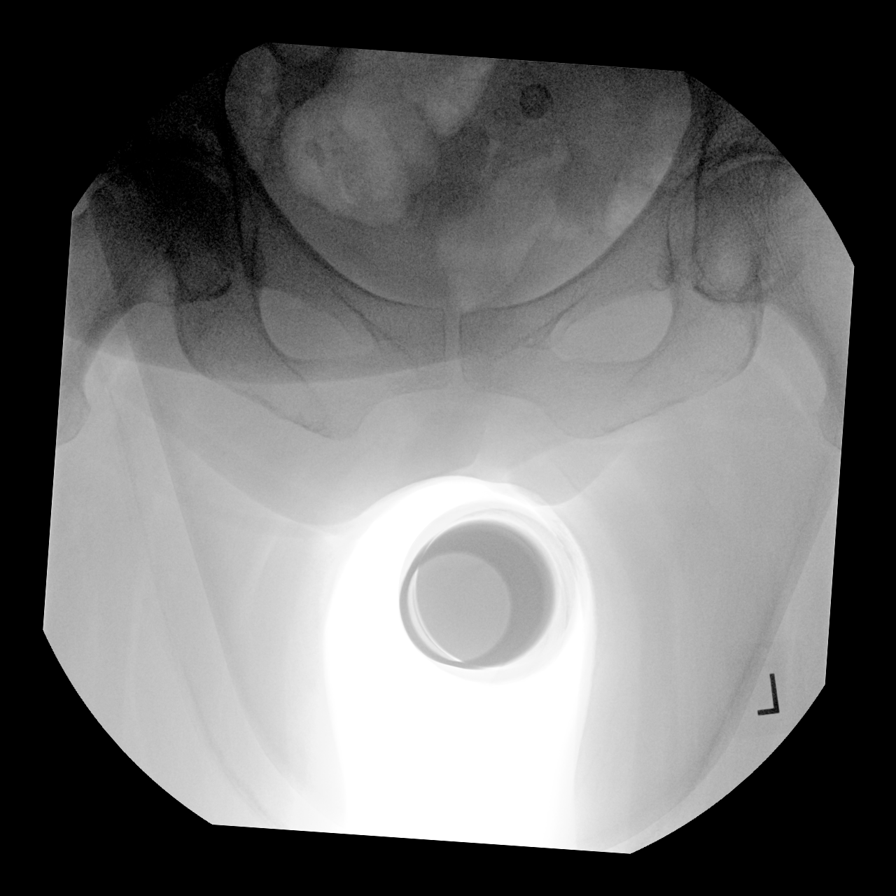
[im 3/4]
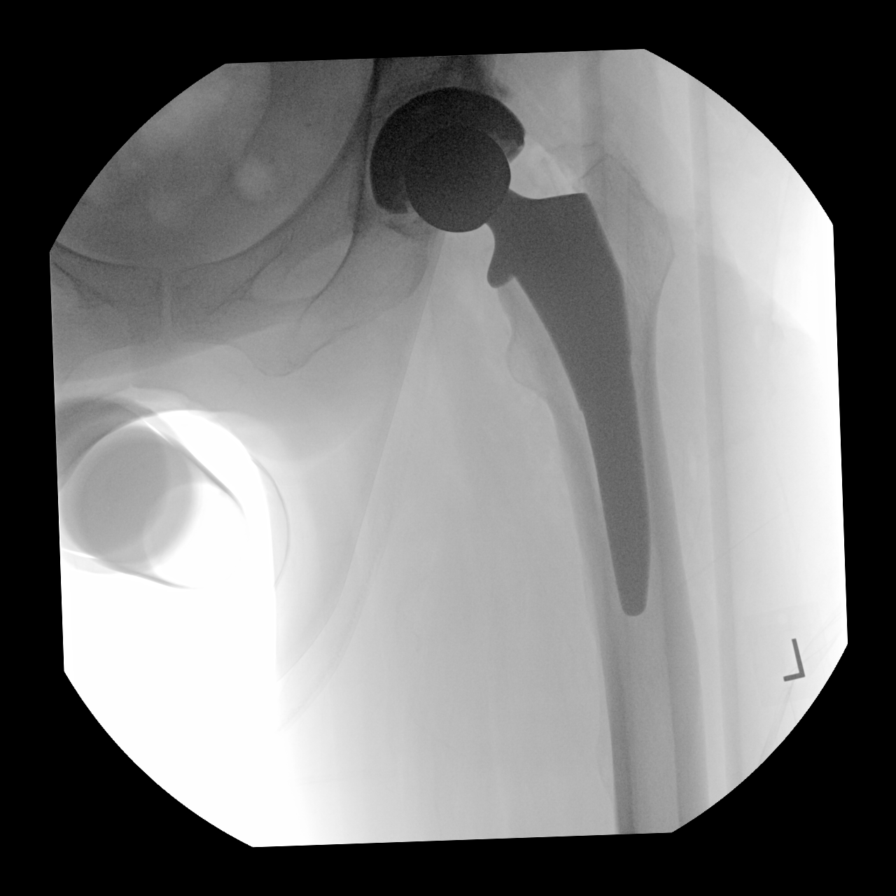
[im 4/4]
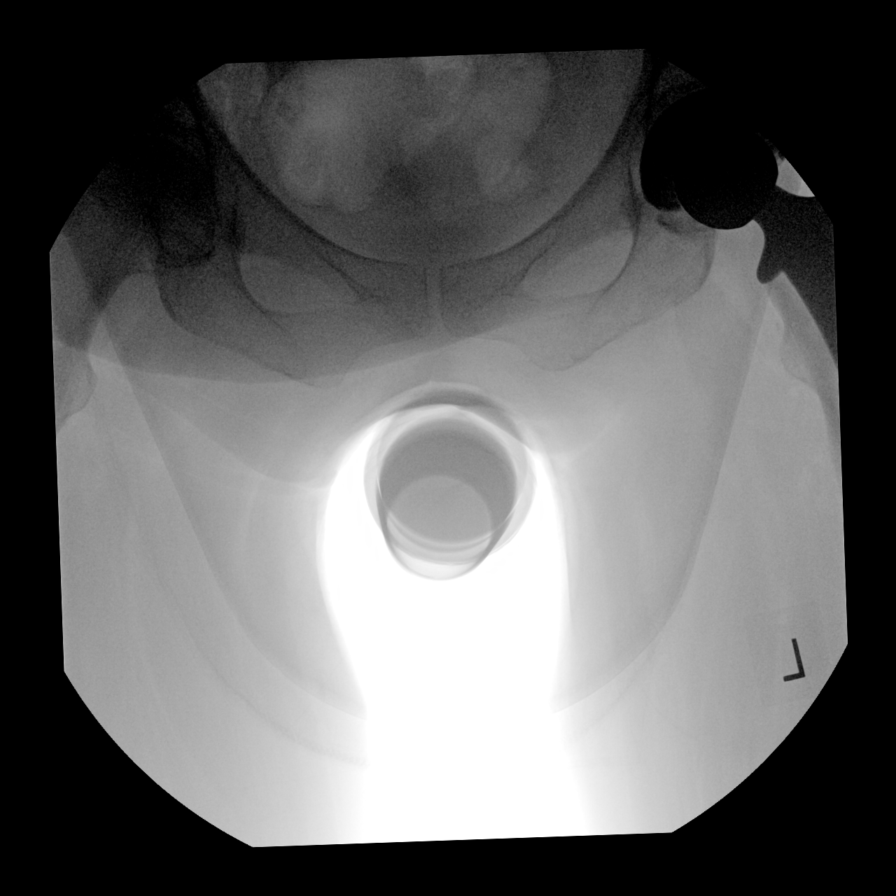

[4 of 4 positions shown; findings below may reference images not displayed]

FINDINGS: Four intraoperative fluoroscopic images are provided during left
total hip arthroplasty. No acute complication is evident on these
limited images.
IMPRESSION: Intraoperative images during left total hip arthroplasty.

## 2022-09-28 DIAGNOSIS — M1712 Unilateral primary osteoarthritis, left knee: Secondary | ICD-10-CM | POA: Diagnosis not present

## 2022-09-29 ENCOUNTER — Encounter: Payer: Self-pay | Admitting: Orthopaedic Surgery

## 2022-09-29 ENCOUNTER — Ambulatory Visit (INDEPENDENT_AMBULATORY_CARE_PROVIDER_SITE_OTHER): Payer: 59 | Admitting: Orthopaedic Surgery

## 2022-09-29 VITALS — Ht 64.0 in | Wt 172.0 lb

## 2022-09-29 DIAGNOSIS — Z96652 Presence of left artificial knee joint: Secondary | ICD-10-CM

## 2022-09-29 DIAGNOSIS — Z96642 Presence of left artificial hip joint: Secondary | ICD-10-CM

## 2022-09-29 NOTE — Progress Notes (Signed)
Post-Op Visit Note   Patient: Taylor Cohen           Date of Birth: [birth year]           MRN: [id number] Visit Date: 09/29/2022 PCP: Toma Deiters, MD   Assessment & Plan: Patient returns post left hip arthroplasty 08/18/2021 and left TKA 3//24.  Initial problems with knee flexion Marcaine injection and more therapy she is picked up flexion to 94 degrees.  Unfortunately with quad work leg presses straight leg raising she has developed sharp anterior left groin pain with strain of the reflected head of the rectus muscle.  No pain with internal/external rotation of her hip.  She will have to back off the therapy activities that are bothering the hip flexors.  She can continue to work on crossing her ankle and work on knee flexion.  I will recheck her in 4 weeks.  Chief Complaint:  Chief Complaint  Patient presents with   Left Knee - Follow-up, Routine Post Op    07/25/2022 Left TKA   Left Hip - Pain   Visit Diagnoses:  1. Status post total left knee replacement   2. S/P total left hip arthroplasty     Plan: Recheck 4 weeks.  Follow-Up Instructions: Return in about 4 weeks (around 10/27/2022).   Orders:  No orders of the defined types were placed in this encounter.  No orders of the defined types were placed in this encounter.   Imaging: No results found.  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 Left; s/p mastectomy and chemo   Depression    Diabetes (HCC)    Type 2   Dyspnea    GERD (gastroesophageal reflux disease)    History of kidney stones    HLD (hyperlipidemia)    HTN (hypertension)    Pneumonia    Hx    Family History  Problem Relation Age of Onset   Colon cancer Neg Hx    Stomach cancer Neg Hx     Esophageal cancer Neg Hx     Past Surgical History:  Procedure Laterality Date   BALLOON DILATION N/A 03/02/2021   Procedure: BALLOON DILATION;  Surgeon: Lanelle Bal, DO;  [facility];  Service: Endoscopy;  Laterality: N/A;   BIOPSY  03/02/2021   Procedure: BIOPSY;  Surgeon: Lanelle Bal, DO;  [facility];  Service: Endoscopy;;   BREAST SURGERY     left mastectomy   COLONOSCOPY     [facility]   ESOPHAGOGASTRODUODENOSCOPY (EGD) WITH PROPOFOL N/A 03/02/2021   Procedure: ESOPHAGOGASTRODUODENOSCOPY (EGD) WITH PROPOFOL;  Surgeon: Lanelle Bal, DO;  [facility];  Service: Endoscopy;  Laterality: N/A;  10:00am   MASTECTOMY Left    2004 or 2005   TOTAL HIP ARTHROPLASTY Left 08/18/2021   Procedure: Left TOTAL HIP ARTHROPLASTY ANTERIOR APPROACH;  Surgeon: Eldred Manges, MD;  [facility];  Service: Orthopedics;  Laterality: Left;   TOTAL KNEE ARTHROPLASTY Left 07/25/2022   Procedure: LEFT TOTAL KNEE ARTHROPLASTY;  Surgeon: Eldred Manges, MD;  [facility];  Service: Orthopedics;  Laterality: Left;  Needs RNFA   TUBAL LIGATION     Social History  Occupational History   Not on file  Tobacco Use   Smoking status: Never   Smokeless tobacco: Never  Vaping Use   Vaping Use: Never used  Substance and Sexual Activity   Alcohol use: Never   Drug use: Never   Sexual activity: Yes

## 2022-10-03 DIAGNOSIS — M1712 Unilateral primary osteoarthritis, left knee: Secondary | ICD-10-CM | POA: Diagnosis not present

## 2022-10-04 DIAGNOSIS — Z1231 Encounter for screening mammogram for malignant neoplasm of breast: Secondary | ICD-10-CM | POA: Diagnosis not present

## 2022-10-05 DIAGNOSIS — M1712 Unilateral primary osteoarthritis, left knee: Secondary | ICD-10-CM | POA: Diagnosis not present

## 2022-10-10 ENCOUNTER — Telehealth: Payer: Self-pay | Admitting: Radiology

## 2022-10-10 ENCOUNTER — Other Ambulatory Visit: Payer: Self-pay | Admitting: Orthopaedic Surgery

## 2022-10-10 DIAGNOSIS — Z96642 Presence of left artificial hip joint: Secondary | ICD-10-CM

## 2022-10-10 DIAGNOSIS — M1712 Unilateral primary osteoarthritis, left knee: Secondary | ICD-10-CM | POA: Diagnosis not present

## 2022-10-10 DIAGNOSIS — M25552 Pain in left hip: Secondary | ICD-10-CM

## 2022-10-10 NOTE — Telephone Encounter (Signed)
Patient called [facility] office requesting return call. I spoke with patient. She states that left hip continues to bother her and that she is having spasms in the hip when she lays down. The ibuprofen and tylenol that you recommended do not help at all.  Please adviise.

## 2022-10-11 NOTE — Telephone Encounter (Signed)
MRI entered 

## 2022-10-11 NOTE — Addendum Note (Signed)
Addended by: Rogers Seeds on: 10/11/2022 08:24 AM   Modules accepted: Orders

## 2022-10-12 DIAGNOSIS — M1712 Unilateral primary osteoarthritis, left knee: Secondary | ICD-10-CM | POA: Diagnosis not present

## 2022-10-14 DIAGNOSIS — M25552 Pain in left hip: Secondary | ICD-10-CM | POA: Diagnosis not present

## 2022-10-14 DIAGNOSIS — G8929 Other chronic pain: Secondary | ICD-10-CM | POA: Diagnosis not present

## 2022-10-14 DIAGNOSIS — Z96642 Presence of left artificial hip joint: Secondary | ICD-10-CM | POA: Diagnosis not present

## 2022-10-20 ENCOUNTER — Ambulatory Visit (INDEPENDENT_AMBULATORY_CARE_PROVIDER_SITE_OTHER): Payer: 59 | Admitting: Orthopaedic Surgery

## 2022-10-20 ENCOUNTER — Encounter: Payer: Self-pay | Admitting: Orthopaedic Surgery

## 2022-10-20 VITALS — Ht 64.0 in | Wt 172.0 lb

## 2022-10-20 DIAGNOSIS — Z96652 Presence of left artificial knee joint: Secondary | ICD-10-CM

## 2022-10-20 NOTE — Progress Notes (Signed)
Post-Op Visit Note   Patient: Taylor Cohen           Date of Birth: Jun 03, 1955           MRN: [id number] Visit Date: 10/20/2022 PCP: Toma Deiters, MD   Assessment & Plan: Follow-up left total knee arthroplasty 07/25/2022.  During postop rehab.  She pulled some muscles anteriorly in her groin but this is getting better every week.  Has flexion past 105 degrees.  Passive full extension still has weak quad which was delayed due to the strain anteriorly to the left hip.  She likely may have cold reflected head of the rectus with a strain.  It is getting better.  She will work on leg lifts ankle weights at the ankle or core strap wrapped wrapped around her ankle doing knee extensions.  Recheck 2 months.  Chief Complaint:  Chief Complaint  Patient presents with   Left Hip - Pain, Follow-up    MRI review   Visit Diagnoses:  1. Status post total left knee replacement     Plan: ROV 2 months  Follow-Up Instructions: Return in about 2 months (around 12/20/2022).   Orders:  No orders of the defined types were placed in this encounter.  No orders of the defined types were placed in this encounter.   Imaging: No results found.  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 Left; s/p mastectomy and chemo   Depression    Diabetes (HCC)    Type 2   Dyspnea    GERD (gastroesophageal reflux disease)    History of kidney stones    HLD (hyperlipidemia)    HTN (hypertension)    Pneumonia    Hx    Family History  Problem Relation Age of Onset   Colon cancer Neg Hx    Stomach cancer Neg Hx    Esophageal cancer Neg Hx     Past Surgical History:  Procedure Laterality Date   BALLOON DILATION N/A 03/02/2021   Procedure: BALLOON DILATION;   Surgeon: Lanelle Bal, DO;  [facility];  Service: Endoscopy;  Laterality: N/A;   BIOPSY  03/02/2021   Procedure: BIOPSY;  Surgeon: Lanelle Bal, DO;  [facility];  Service: Endoscopy;;   BREAST SURGERY     left mastectomy   COLONOSCOPY     [facility]   ESOPHAGOGASTRODUODENOSCOPY (EGD) WITH PROPOFOL N/A 03/02/2021   Procedure: ESOPHAGOGASTRODUODENOSCOPY (EGD) WITH PROPOFOL;  Surgeon: Lanelle Bal, DO;  [facility];  Service: Endoscopy;  Laterality: N/A;  10:00am   MASTECTOMY Left    2004 or 2005   TOTAL HIP ARTHROPLASTY Left 08/18/2021   Procedure: Left TOTAL HIP ARTHROPLASTY ANTERIOR APPROACH;  Surgeon: Eldred Manges, MD;  [facility];  Service: Orthopedics;  Laterality: Left;   TOTAL KNEE ARTHROPLASTY Left 07/25/2022   Procedure: LEFT TOTAL KNEE ARTHROPLASTY;  Surgeon: Eldred Manges, MD;  [facility];  Service: Orthopedics;  Laterality: Left;  Needs RNFA   TUBAL LIGATION     Social History   Occupational History   Not on file  Tobacco Use   Smoking status: Never   Smokeless tobacco: Never  Vaping Use  Vaping Use: Never used  Substance and Sexual Activity   Alcohol use: Never   Drug use: Never   Sexual activity: Yes

## 2022-10-27 ENCOUNTER — Encounter: Payer: 59 | Admitting: Orthopaedic Surgery

## 2022-11-03 DIAGNOSIS — M810 Age-related osteoporosis without current pathological fracture: Secondary | ICD-10-CM | POA: Diagnosis not present

## 2022-12-15 ENCOUNTER — Encounter: Payer: Self-pay | Admitting: Orthopaedic Surgery

## 2022-12-15 ENCOUNTER — Ambulatory Visit: Payer: 59 | Admitting: Orthopaedic Surgery

## 2022-12-15 VITALS — Ht 63.5 in | Wt 177.0 lb

## 2022-12-15 DIAGNOSIS — M6281 Muscle weakness (generalized): Secondary | ICD-10-CM | POA: Diagnosis not present

## 2022-12-15 NOTE — Progress Notes (Signed)
Office Visit Note   Patient: Taylor Cohen           Date of Birth: April 20, 1956           MRN: [id number] Visit Date: 12/15/2022              Requested by: Toma Deiters, MD [address] [place],  [place] [address] PCP: Toma Deiters, MD   Assessment & Plan: Visit Diagnoses:  1. Weakness of left quadriceps muscle     Plan: Patient with quad strain while doing therapy after total knee arthroplasty.  Is now been 4 months since surgery she still has weakness will be going to [employer] we discussed doing the leg press machine and also knee extension machine in the sitting position which she should be able to do to strengthen her quad without aggravating it.  She will have to void doing straight leg raising against resistance since this tends to aggravate her anteriorly in the groin.  Recheck 3 months.  Follow-Up Instructions: Return in about 3 months (around 03/17/2023).   Orders:  No orders of the defined types were placed in this encounter.  No orders of the defined types were placed in this encounter.     Procedures: No procedures performed   Clinical Data: No additional findings.   Subjective: Chief Complaint  Patient presents with   Left Hip - Follow-up, Pain   Left Knee - Follow-up    07/25/2022 Left TKA    HPI  67 -year-old female had left total hip arthroplasty March 2023 did well.  March 2024 had left total knee arthroplasty was doing well when injured self doing therapy with likely reflected head of the rectus strain.  She has pain anteriorly over the groin her x-rays of her hip look good.  She does not have pain when she does knee extensions in the sitting position but when she tries to do straight leg raising against resistance she has sharp anterior groin pain.  Some discomfort with resisted iliopsoas exercises.  No problems with opposite right knee or right hip.  Review of Systems updated unchanged   Objective: Vital Signs: Ht 5' 3.5" (1.613  m)   Wt 177 lb (80.3 kg)   BMI 30.86 kg/m   Physical Exam Constitutional:      Appearance: She is well-developed.  HENT:     Head: Normocephalic.     Right Ear: External ear normal.     Left Ear: External ear normal. There is no impacted cerumen.  Eyes:     Pupils: Pupils are equal, round, and reactive to light.  Neck:     Thyroid: No thyromegaly.     Trachea: No tracheal deviation.  Cardiovascular:     Rate and Rhythm: Normal rate.  Pulmonary:     Effort: Pulmonary effort is normal.  Abdominal:     Palpations: Abdomen is soft.  Musculoskeletal:     Cervical back: No rigidity.  Skin:    General: Skin is warm and dry.  Neurological:     Mental Status: She is alert and oriented to person, place, and time.  Psychiatric:        Behavior: Behavior normal.     Ortho Exam patient still uses a hop step getting up left foot first on the single step in the exam room.  Right foot she steps up on normal pattern.  Abductors are strong right and left sensation is intact.  Tenderness anteriorly over the groin.  In sitting position she can demonstrate better resistance to knee extension contract in the quad.  Specialty Comments:  No specialty comments available.  Imaging: No results found.   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 Left; s/p mastectomy and chemo   Depression    Diabetes (HCC)    Type 2   Dyspnea    GERD (gastroesophageal reflux disease)    History of kidney stones    HLD (hyperlipidemia)    HTN (hypertension)    Pneumonia    Hx    Family History  Problem Relation Age of Onset   Colon cancer Neg Hx    Stomach cancer Neg Hx    Esophageal cancer Neg Hx     Past  Surgical History:  Procedure Laterality Date   BALLOON DILATION N/A 03/02/2021   Procedure: BALLOON DILATION;  Surgeon: Lanelle Bal, DO;  [facility];  Service: Endoscopy;  Laterality: N/A;   BIOPSY  03/02/2021   Procedure: BIOPSY;  Surgeon: Lanelle Bal, DO;  [facility];  Service: Endoscopy;;   BREAST SURGERY     left mastectomy   COLONOSCOPY     [facility]   ESOPHAGOGASTRODUODENOSCOPY (EGD) WITH PROPOFOL N/A 03/02/2021   Procedure: ESOPHAGOGASTRODUODENOSCOPY (EGD) WITH PROPOFOL;  Surgeon: Lanelle Bal, DO;  [facility];  Service: Endoscopy;  Laterality: N/A;  10:00am   MASTECTOMY Left    2004 or 2005   TOTAL HIP ARTHROPLASTY Left 08/18/2021   Procedure: Left TOTAL HIP ARTHROPLASTY ANTERIOR APPROACH;  Surgeon: Eldred Manges, MD;  [facility];  Service: Orthopedics;  Laterality: Left;   TOTAL KNEE ARTHROPLASTY Left 07/25/2022   Procedure: LEFT TOTAL KNEE ARTHROPLASTY;  Surgeon: Eldred Manges, MD;  [facility];  Service: Orthopedics;  Laterality: Left;  Needs RNFA   TUBAL LIGATION     Social History   Occupational History   Not on file  Tobacco Use   Smoking status: Never   Smokeless tobacco: Never  Vaping Use   Vaping status: Never Used  Substance and Sexual Activity   Alcohol use: Never   Drug use: Never   Sexual activity: Yes

## 2022-12-26 DIAGNOSIS — E1143 Type 2 diabetes mellitus with diabetic autonomic (poly)neuropathy: Secondary | ICD-10-CM | POA: Diagnosis not present

## 2022-12-26 DIAGNOSIS — I10 Essential (primary) hypertension: Secondary | ICD-10-CM | POA: Diagnosis not present

## 2022-12-26 DIAGNOSIS — J4530 Mild persistent asthma, uncomplicated: Secondary | ICD-10-CM | POA: Diagnosis not present

## 2022-12-26 DIAGNOSIS — Z0000 Encounter for general adult medical examination without abnormal findings: Secondary | ICD-10-CM | POA: Diagnosis not present

## 2022-12-26 DIAGNOSIS — E0843 Diabetes mellitus due to underlying condition with diabetic autonomic (poly)neuropathy: Secondary | ICD-10-CM | POA: Diagnosis not present

## 2022-12-26 DIAGNOSIS — E782 Mixed hyperlipidemia: Secondary | ICD-10-CM | POA: Diagnosis not present

## 2023-01-12 ENCOUNTER — Telehealth (HOSPITAL_BASED_OUTPATIENT_CLINIC_OR_DEPARTMENT_OTHER): Payer: Self-pay

## 2023-01-12 NOTE — Telephone Encounter (Signed)
Defer to yates.  Sorry.

## 2023-01-12 NOTE — Telephone Encounter (Signed)
Message from [facility] called and says when she does the exercises Dr. Ophelia Charter gave her it hurts.  The Tylenol isn't touching it.  Is the something she can take?  She uses [employer].   Can you advise on this please

## 2023-01-12 NOTE — Telephone Encounter (Signed)
Not sure what to do with this

## 2023-01-13 MED ORDER — TRAMADOL HCL 50 MG PO TABS: 50.0000 mg | ORAL_TABLET | Freq: Every day | ORAL | 0 refills | Status: AC | PRN

## 2023-01-13 NOTE — Addendum Note (Signed)
Addended by: Mayra Reel on: 01/13/2023 10:16 PM   Modules accepted: Orders

## 2023-01-13 NOTE — Telephone Encounter (Signed)
I sent tramadol

## 2023-01-16 NOTE — Telephone Encounter (Signed)
Left another message.

## 2023-01-16 NOTE — Telephone Encounter (Signed)
Lvm for pt to cb to advise 

## 2023-02-12 ENCOUNTER — Other Ambulatory Visit (HOSPITAL_BASED_OUTPATIENT_CLINIC_OR_DEPARTMENT_OTHER): Payer: Self-pay | Admitting: Orthopaedic Surgery

## 2023-03-14 DIAGNOSIS — I10 Essential (primary) hypertension: Secondary | ICD-10-CM | POA: Diagnosis not present

## 2023-03-14 DIAGNOSIS — L600 Ingrowing nail: Secondary | ICD-10-CM | POA: Diagnosis not present

## 2023-03-16 ENCOUNTER — Encounter: Payer: Self-pay | Admitting: Orthopaedic Surgery

## 2023-03-16 ENCOUNTER — Ambulatory Visit: Payer: 59 | Admitting: Orthopaedic Surgery

## 2023-03-16 VITALS — Ht 63.5 in | Wt 168.0 lb

## 2023-03-16 DIAGNOSIS — Z96652 Presence of left artificial knee joint: Secondary | ICD-10-CM

## 2023-03-16 DIAGNOSIS — Z96642 Presence of left artificial hip joint: Secondary | ICD-10-CM | POA: Diagnosis not present

## 2023-03-16 DIAGNOSIS — M6281 Muscle weakness (generalized): Secondary | ICD-10-CM | POA: Diagnosis not present

## 2023-03-16 DIAGNOSIS — R809 Proteinuria, unspecified: Secondary | ICD-10-CM | POA: Diagnosis not present

## 2023-03-16 NOTE — Progress Notes (Signed)
Office Visit Note   Patient: Taylor Cohen           Date of Birth: Dec 26, 1955           MRN: [id number] Visit Date: 03/16/2023              Requested by: Toma Deiters, MD [address] [place],  [place] [address] PCP: Toma Deiters, MD   Assessment & Plan: Visit Diagnoses:  1. Weakness of left quadriceps muscle   2. S/P total left hip arthroplasty   3. Status post total left knee replacement     Plan: Over several exercises she will work on quad strengthening on her own at home using purse over her ankle as a counterweight.  She will work on the extension exercises recheck 1 month.  Follow-Up Instructions: No follow-ups on file.   Orders:  No orders of the defined types were placed in this encounter.  No orders of the defined types were placed in this encounter.     Procedures: No procedures performed   Clinical Data: No additional findings.   Subjective: Chief Complaint  Patient presents with   Left Knee - Follow-up    07/25/2022 Left TKA   Left Hip - Follow-up    HPI 67 year old female post left total hip arthroplasty was doing well though she did her left total knee in 2 months after left total knee she is doing therapy and told her flexor likely reflected head of the rectus with anterior groin pain since that time.  She like to [employer] she could not afford it.  She has been doing some exercises on her home she is walking better she can get up on a step with a slight hop.  Still lacks a few degrees of extension of her knee which she is lost when she had increased groin pain.  Review of Systems updated unchanged   Objective: Vital Signs: Ht 5' 3.5" (1.613 m)   Wt 168 lb (76.2 kg)   BMI 29.29 kg/m   Physical Exam Constitutional:      Appearance: She is well-developed.  HENT:     Head: Normocephalic.     Right Ear: External ear normal.     Left Ear: External ear normal. There is no impacted cerumen.  Eyes:     Pupils: Pupils  are equal, round, and reactive to light.  Neck:     Thyroid: No thyromegaly.     Trachea: No tracheal deviation.  Cardiovascular:     Rate and Rhythm: Normal rate.  Pulmonary:     Effort: Pulmonary effort is normal.  Abdominal:     Palpations: Abdomen is soft.  Musculoskeletal:     Cervical back: No rigidity.  Skin:    General: Skin is warm and dry.  Neurological:     Mental Status: She is alert and oriented to person, place, and time.  Psychiatric:        Behavior: Behavior normal.     Ortho Exam left knee flexion 110 she lacks 4 degrees reaching full extension.  She can do a straight leg raise has increased pain with significant resistance and points anteriorly to the groin.  X-rays of her hip are good no evidence of hip avulsion injury.  Specialty Comments:  No specialty comments available.  Imaging: No results found.   PMFS History: Patient Active Problem List   Diagnosis Date Noted   Weakness of left quadriceps muscle 12/15/2022   S/P  total knee arthroplasty 07/25/2022   S/P total left hip arthroplasty 09/02/2021   Dysphagia 02/15/2021   Past Medical History:  Diagnosis Date   Arthritis    Asthma    Breast cancer (HCC)    Around 2004-2005. Left; s/p mastectomy and chemo   Depression    Diabetes (HCC)    Type 2   Dyspnea    GERD (gastroesophageal reflux disease)    History of kidney stones    HLD (hyperlipidemia)    HTN (hypertension)    Pneumonia    Hx    Family History  Problem Relation Age of Onset   Colon cancer Neg Hx    Stomach cancer Neg Hx    Esophageal cancer Neg Hx     Past Surgical History:  Procedure Laterality Date   BALLOON DILATION N/A 03/02/2021   Procedure: BALLOON DILATION;  Surgeon: Lanelle Bal, DO;  [facility];  Service: Endoscopy;  Laterality: N/A;   BIOPSY  03/02/2021   Procedure: BIOPSY;  Surgeon: Lanelle Bal, DO;  [facility];  Service: Endoscopy;;   BREAST SURGERY     left mastectomy    COLONOSCOPY     [facility]   ESOPHAGOGASTRODUODENOSCOPY (EGD) WITH PROPOFOL N/A 03/02/2021   Procedure: ESOPHAGOGASTRODUODENOSCOPY (EGD) WITH PROPOFOL;  Surgeon: Lanelle Bal, DO;  [facility];  Service: Endoscopy;  Laterality: N/A;  10:00am   MASTECTOMY Left    2004 or 2005   TOTAL HIP ARTHROPLASTY Left 08/18/2021   Procedure: Left TOTAL HIP ARTHROPLASTY ANTERIOR APPROACH;  Surgeon: Eldred Manges, MD;  [facility];  Service: Orthopedics;  Laterality: Left;   TOTAL KNEE ARTHROPLASTY Left 07/25/2022   Procedure: LEFT TOTAL KNEE ARTHROPLASTY;  Surgeon: Eldred Manges, MD;  [facility];  Service: Orthopedics;  Laterality: Left;  Needs RNFA   TUBAL LIGATION     Social History   Occupational History   Not on file  Tobacco Use   Smoking status: Never   Smokeless tobacco: Never  Vaping Use   Vaping status: Never Used  Substance and Sexual Activity   Alcohol use: Never   Drug use: Never   Sexual activity: Yes

## 2023-03-19 DIAGNOSIS — K5730 Diverticulosis of large intestine without perforation or abscess without bleeding: Secondary | ICD-10-CM | POA: Diagnosis not present

## 2023-03-19 DIAGNOSIS — N3090 Cystitis, unspecified without hematuria: Secondary | ICD-10-CM | POA: Diagnosis not present

## 2023-03-19 DIAGNOSIS — Z7982 Long term (current) use of aspirin: Secondary | ICD-10-CM | POA: Diagnosis not present

## 2023-03-19 DIAGNOSIS — Z79899 Other long term (current) drug therapy: Secondary | ICD-10-CM | POA: Diagnosis not present

## 2023-03-19 DIAGNOSIS — K449 Diaphragmatic hernia without obstruction or gangrene: Secondary | ICD-10-CM | POA: Diagnosis not present

## 2023-03-19 DIAGNOSIS — N390 Urinary tract infection, site not specified: Secondary | ICD-10-CM | POA: Diagnosis not present

## 2023-03-19 DIAGNOSIS — Z7984 Long term (current) use of oral hypoglycemic drugs: Secondary | ICD-10-CM | POA: Diagnosis not present

## 2023-03-19 DIAGNOSIS — E119 Type 2 diabetes mellitus without complications: Secondary | ICD-10-CM | POA: Diagnosis not present

## 2023-03-19 DIAGNOSIS — R1032 Left lower quadrant pain: Secondary | ICD-10-CM | POA: Diagnosis not present

## 2023-03-19 DIAGNOSIS — I10 Essential (primary) hypertension: Secondary | ICD-10-CM | POA: Diagnosis not present

## 2023-03-27 DIAGNOSIS — N3000 Acute cystitis without hematuria: Secondary | ICD-10-CM | POA: Diagnosis not present

## 2023-03-27 DIAGNOSIS — I10 Essential (primary) hypertension: Secondary | ICD-10-CM | POA: Diagnosis not present

## 2023-04-13 ENCOUNTER — Encounter: Payer: Self-pay | Admitting: Orthopaedic Surgery

## 2023-04-13 ENCOUNTER — Ambulatory Visit: Payer: 59 | Admitting: Orthopaedic Surgery

## 2023-04-13 ENCOUNTER — Other Ambulatory Visit: Payer: Self-pay

## 2023-04-13 VITALS — Ht 63.5 in | Wt 168.0 lb

## 2023-04-13 DIAGNOSIS — Z96652 Presence of left artificial knee joint: Secondary | ICD-10-CM | POA: Diagnosis not present

## 2023-04-13 DIAGNOSIS — M25552 Pain in left hip: Secondary | ICD-10-CM

## 2023-04-13 DIAGNOSIS — M6281 Muscle weakness (generalized): Secondary | ICD-10-CM | POA: Diagnosis not present

## 2023-04-13 DIAGNOSIS — Z96642 Presence of left artificial hip joint: Secondary | ICD-10-CM | POA: Diagnosis not present

## 2023-04-13 NOTE — Progress Notes (Signed)
Office Visit Note   Patient: Taylor Cohen           Date of Birth: 1955/09/29           MRN: [id number] Visit Date: 04/13/2023              Requested by: Toma Deiters, MD [address] [place],  [place] [address] PCP: Toma Deiters, MD   Assessment & Plan: Visit Diagnoses:  1. Pain in left hip   2. S/P total left hip arthroplasty   3. Status post total left knee replacement   4. Weakness of left quadriceps muscle     Plan: Patient will work on passive hip flexion.  We went over several exercises for quad strengthening sitting on the counter using her purse strap wrapped around her ankle she has a book bag at home she can do.  She can do straight leg raising with for strep on her ankle as well.  I will check her back again in 3 months.  Still has a hop step when she gets up left foot first on stairs.  She likely had pulled quad muscle reflected head of the rectus most likely but she should get some improvement with strengthening and passive range of motion.  Follow-Up Instructions: Return in about 3 months (around 07/14/2023).   Orders:  Orders Placed This Encounter  Procedures   XR HIP UNILAT W OR W/O PELVIS 2-3 VIEWS LEFT   No orders of the defined types were placed in this encounter.     Procedures: No procedures performed   Clinical Data: No additional findings.   Subjective: Chief Complaint  Patient presents with   Left Knee - Follow-up    07/25/2022 Left TKA   Left Hip - Follow-up    08/18/2021 Left THA    HPI follow-up left knee and hip replacement.  During her knee rehab with quad strengthening she had sharp pain anteriorly in her groin and has problems with hip flexion since that time.  She has had problems with passive hip flexion as well.  She stopped her therapy.  She is ambulating without a cane.  Review of Systems reviewed updated unchanged   Objective: Vital Signs: Ht 5' 3.5" (1.613 m)   Wt 168 lb (76.2 kg)   BMI 29.29 kg/m    Physical Exam Constitutional:      Appearance: She is well-developed.  HENT:     Head: Normocephalic.     Right Ear: External ear normal.     Left Ear: External ear normal. There is no impacted cerumen.  Eyes:     Pupils: Pupils are equal, round, and reactive to light.  Neck:     Thyroid: No thyromegaly.     Trachea: No tracheal deviation.  Cardiovascular:     Rate and Rhythm: Normal rate.  Pulmonary:     Effort: Pulmonary effort is normal.  Abdominal:     Palpations: Abdomen is soft.  Musculoskeletal:     Cervical back: No rigidity.  Skin:    General: Skin is warm and dry.  Neurological:     Mental Status: She is alert and oriented to person, place, and time.  Psychiatric:        Behavior: Behavior normal.     Ortho Exam patient has weakness with hip flexion.  She can do straight leg raising in the sitting position still has some quad weakness but improved from previous visit opposite right hip is normal.  Supine position she still lacks some hip flexion on the left last 30 degrees actively but lacks only 20 degrees passively.  Specialty Comments:  No specialty comments available.  Imaging: No results found.   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 Left; s/p mastectomy and chemo   Depression    Diabetes (HCC)    Type 2   Dyspnea    GERD (gastroesophageal reflux disease)    History of kidney stones    HLD (hyperlipidemia)    HTN (hypertension)    Pneumonia    Hx    Family History  Problem Relation Age of Onset   Colon cancer Neg Hx    Stomach cancer Neg Hx    Esophageal cancer Neg Hx     Past Surgical History:  Procedure Laterality Date   BALLOON DILATION N/A  03/02/2021   Procedure: BALLOON DILATION;  Surgeon: Lanelle Bal, DO;  [facility];  Service: Endoscopy;  Laterality: N/A;   BIOPSY  03/02/2021   Procedure: BIOPSY;  Surgeon: Lanelle Bal, DO;  [facility];  Service: Endoscopy;;   BREAST SURGERY     left mastectomy   COLONOSCOPY     [facility]   ESOPHAGOGASTRODUODENOSCOPY (EGD) WITH PROPOFOL N/A 03/02/2021   Procedure: ESOPHAGOGASTRODUODENOSCOPY (EGD) WITH PROPOFOL;  Surgeon: Lanelle Bal, DO;  [facility];  Service: Endoscopy;  Laterality: N/A;  10:00am   MASTECTOMY Left    2004 or 2005   TOTAL HIP ARTHROPLASTY Left 08/18/2021   Procedure: Left TOTAL HIP ARTHROPLASTY ANTERIOR APPROACH;  Surgeon: Eldred Manges, MD;  [facility];  Service: Orthopedics;  Laterality: Left;   TOTAL KNEE ARTHROPLASTY Left 07/25/2022   Procedure: LEFT TOTAL KNEE ARTHROPLASTY;  Surgeon: Eldred Manges, MD;  [facility];  Service: Orthopedics;  Laterality: Left;  Needs RNFA   TUBAL LIGATION     Social History   Occupational History   Not on file  Tobacco Use   Smoking status: Never   Smokeless tobacco: Never  Vaping Use   Vaping status: Never Used  Substance and Sexual Activity   Alcohol use: Never   Drug use: Never   Sexual activity: Yes

## 2023-05-09 DIAGNOSIS — N23 Unspecified renal colic: Secondary | ICD-10-CM | POA: Diagnosis not present

## 2023-05-09 DIAGNOSIS — I10 Essential (primary) hypertension: Secondary | ICD-10-CM | POA: Diagnosis not present

## 2023-05-15 DIAGNOSIS — R109 Unspecified abdominal pain: Secondary | ICD-10-CM | POA: Diagnosis not present

## 2023-05-15 DIAGNOSIS — R932 Abnormal findings on diagnostic imaging of liver and biliary tract: Secondary | ICD-10-CM | POA: Diagnosis not present

## 2023-05-15 DIAGNOSIS — R1084 Generalized abdominal pain: Secondary | ICD-10-CM | POA: Diagnosis not present

## 2023-05-15 DIAGNOSIS — N281 Cyst of kidney, acquired: Secondary | ICD-10-CM | POA: Diagnosis not present

## 2023-05-31 ENCOUNTER — Other Ambulatory Visit (INDEPENDENT_AMBULATORY_CARE_PROVIDER_SITE_OTHER): Payer: 59

## 2023-05-31 ENCOUNTER — Ambulatory Visit: Payer: 59 | Admitting: Orthopaedic Surgery

## 2023-05-31 VITALS — Ht 63.5 in | Wt 168.0 lb

## 2023-05-31 DIAGNOSIS — M6281 Muscle weakness (generalized): Secondary | ICD-10-CM

## 2023-05-31 DIAGNOSIS — G8929 Other chronic pain: Secondary | ICD-10-CM | POA: Diagnosis not present

## 2023-05-31 DIAGNOSIS — M25562 Pain in left knee: Secondary | ICD-10-CM | POA: Diagnosis not present

## 2023-05-31 DIAGNOSIS — Z96652 Presence of left artificial knee joint: Secondary | ICD-10-CM

## 2023-05-31 NOTE — Addendum Note (Signed)
 Addended by: Rogers Seeds on: 05/31/2023 12:23 PM   Modules accepted: Orders

## 2023-05-31 NOTE — Progress Notes (Signed)
 Office Visit Note   Patient: Taylor Cohen           Date of Birth: 10-Jul-1955           MRN: [id number] Visit Date: 05/31/2023              Requested by: Orpha Yancey LABOR, MD [address] [place],  [place] [address] PCP: Orpha Yancey LABOR, MD   Assessment & Plan: Visit Diagnoses:  1. Chronic pain of left knee   2. Status post total left knee replacement   3. Weakness of left quadriceps muscle     Plan: Patient has persistent lack of full extension in her left total knee arthroplasty also has quad weakness flexion is okay at 110.  She needs formal physical therapy she initially wanted to try it on her own but has not been successful making progress.  Prone positioning with some weight she quit to clinic she started having some increased pain with posterior capsular stretch.  We discussed she needs to stay longer in this position making it 10 minutes and need to increase the weight 10 pounds in the purse.  She has appointment with me at the end of February.  She likes physical therapy at [facility] outpatient here in [place] and works with a therapist that helped her after left total hip arthroplasty.  Follow-Up Instructions: No follow-ups on file.   Orders:  Orders Placed This Encounter  Procedures   XR KNEE 3 VIEW LEFT   No orders of the defined types were placed in this encounter.     Procedures: No procedures performed   Clinical Data: No additional findings.   Subjective: Chief Complaint  Patient presents with   Left Knee - Pain    07/25/2022 Left TKA    HPI 10 months post left total knee arthroplasty.  She had left total hip arthroplasty done 2 years ago.  Persistent quad weakness she has 10 degree flexion contracture left knee.  She has been doing some prone positioning but only last 2 to 3 minutes in that position and send is the posterior capsule start stretching she stops due to pain.  She has quad weakness is still walking with a cane and I can overcome her  quad sitting position with 1 finger at the ankle.  Opposite leg takes 4 finger pressure. Initially with therapy after total knee she states that she had sharp pain and had an injury anterior quad closer to her hip and this made it more difficult for her to do her knee exercises.  X-rays of her knee and the hip in the past of look good new x-rays are obtained of her knee today. Review of Systems all other systems noncontributory.   Objective: Vital Signs: Ht 5' 3.5 (1.613 m)   Wt 168 lb (76.2 kg)   BMI 29.29 kg/m   Physical Exam Constitutional:      Appearance: She is well-developed.  HENT:     Head: Normocephalic.     Right Ear: External ear normal.     Left Ear: External ear normal. There is no impacted cerumen.  Eyes:     Pupils: Pupils are equal, round, and reactive to light.  Neck:     Thyroid: No thyromegaly.     Trachea: No tracheal deviation.  Cardiovascular:     Rate and Rhythm: Normal rate.  Pulmonary:     Effort: Pulmonary effort is normal.  Abdominal:     Palpations: Abdomen is soft.  Musculoskeletal:     Cervical back: No rigidity.  Skin:    General: Skin is warm and dry.  Neurological:     Mental Status: She is alert and oriented to person, place, and time.  Psychiatric:        Behavior: Behavior normal.     Ortho Exam well-healed left total knee arthroplasty incision.  Quad weakness she lacks 8 to 10 degrees full extension flexes to 110.  Quads are weak with resisted testing left only.  Specialty Comments:  No specialty comments available.  Imaging: XR KNEE 3 VIEW LEFT Result Date: 05/31/2023 Three-view x-rays left knee obtained and reviewed.  Well-positioned left total knee arthroplasty without loosening or subsidence.  Right knee osteoarthritis primarily medial compartment. Impression: Satisfactory left total knee arthroplasty with no complications.    PMFS History: Patient Active Problem List   Diagnosis Date Noted   Weakness of left quadriceps  muscle 12/15/2022   S/P total knee arthroplasty 07/25/2022   S/P total left hip arthroplasty 09/02/2021   Dysphagia 02/15/2021   Past Medical History:  Diagnosis Date   Arthritis    Asthma    Breast cancer (HCC)    Around 2004-2005. Left; s/p mastectomy and chemo   Depression    Diabetes (HCC)    Type 2   Dyspnea    GERD (gastroesophageal reflux disease)    History of kidney stones    HLD (hyperlipidemia)    HTN (hypertension)    Pneumonia    Hx    Family History  Problem Relation Age of Onset   Colon cancer Neg Hx    Stomach cancer Neg Hx    Esophageal cancer Neg Hx     Past Surgical History:  Procedure Laterality Date   BALLOON DILATION N/A 03/02/2021   Procedure: BALLOON DILATION;  Surgeon: Cindie Carlin POUR, DO;  [facility];  Service: Endoscopy;  Laterality: N/A;   BIOPSY  03/02/2021   Procedure: BIOPSY;  Surgeon: Cindie Carlin POUR, DO;  [facility];  Service: Endoscopy;;   BREAST SURGERY     left mastectomy   COLONOSCOPY     [facility]   ESOPHAGOGASTRODUODENOSCOPY (EGD) WITH PROPOFOL  N/A 03/02/2021   Procedure: ESOPHAGOGASTRODUODENOSCOPY (EGD) WITH PROPOFOL ;  Surgeon: Cindie Carlin POUR, DO;  [facility];  Service: Endoscopy;  Laterality: N/A;  10:00am   MASTECTOMY Left    2004 or 2005   TOTAL HIP ARTHROPLASTY Left 08/18/2021   Procedure: Left TOTAL HIP ARTHROPLASTY ANTERIOR APPROACH;  Surgeon: Barbarann Oneil BROCKS, MD;  [facility];  Service: Orthopedics;  Laterality: Left;   TOTAL KNEE ARTHROPLASTY Left 07/25/2022   Procedure: LEFT TOTAL KNEE ARTHROPLASTY;  Surgeon: Barbarann Oneil BROCKS, MD;  [facility];  Service: Orthopedics;  Laterality: Left;  Needs RNFA   TUBAL LIGATION     Social History   Occupational History   Not on file  Tobacco Use   Smoking status: Never   Smokeless tobacco: Never  Vaping Use   Vaping status: Never Used  Substance and Sexual Activity   Alcohol use: Never   Drug use: Never   Sexual activity: Yes

## 2023-06-08 DIAGNOSIS — Z9181 History of falling: Secondary | ICD-10-CM | POA: Diagnosis not present

## 2023-06-08 DIAGNOSIS — M6281 Muscle weakness (generalized): Secondary | ICD-10-CM | POA: Diagnosis not present

## 2023-06-08 DIAGNOSIS — R2689 Other abnormalities of gait and mobility: Secondary | ICD-10-CM | POA: Diagnosis not present

## 2023-06-08 DIAGNOSIS — M545 Low back pain, unspecified: Secondary | ICD-10-CM | POA: Diagnosis not present

## 2023-06-08 DIAGNOSIS — Z7409 Other reduced mobility: Secondary | ICD-10-CM | POA: Diagnosis not present

## 2023-06-08 DIAGNOSIS — Z96652 Presence of left artificial knee joint: Secondary | ICD-10-CM | POA: Diagnosis not present

## 2023-06-08 DIAGNOSIS — M25562 Pain in left knee: Secondary | ICD-10-CM | POA: Diagnosis not present

## 2023-06-08 DIAGNOSIS — R29898 Other symptoms and signs involving the musculoskeletal system: Secondary | ICD-10-CM | POA: Diagnosis not present

## 2023-06-08 DIAGNOSIS — M79605 Pain in left leg: Secondary | ICD-10-CM | POA: Diagnosis not present

## 2023-06-08 DIAGNOSIS — M2569 Stiffness of other specified joint, not elsewhere classified: Secondary | ICD-10-CM | POA: Diagnosis not present

## 2023-06-08 DIAGNOSIS — R262 Difficulty in walking, not elsewhere classified: Secondary | ICD-10-CM | POA: Diagnosis not present

## 2023-06-08 DIAGNOSIS — M25552 Pain in left hip: Secondary | ICD-10-CM | POA: Diagnosis not present

## 2023-06-08 DIAGNOSIS — R1030 Lower abdominal pain, unspecified: Secondary | ICD-10-CM | POA: Diagnosis not present

## 2023-06-14 DIAGNOSIS — Z96652 Presence of left artificial knee joint: Secondary | ICD-10-CM | POA: Diagnosis not present

## 2023-06-14 DIAGNOSIS — R262 Difficulty in walking, not elsewhere classified: Secondary | ICD-10-CM | POA: Diagnosis not present

## 2023-06-14 DIAGNOSIS — M79605 Pain in left leg: Secondary | ICD-10-CM | POA: Diagnosis not present

## 2023-06-14 DIAGNOSIS — R2689 Other abnormalities of gait and mobility: Secondary | ICD-10-CM | POA: Diagnosis not present

## 2023-06-14 DIAGNOSIS — M6281 Muscle weakness (generalized): Secondary | ICD-10-CM | POA: Diagnosis not present

## 2023-06-14 DIAGNOSIS — M25552 Pain in left hip: Secondary | ICD-10-CM | POA: Diagnosis not present

## 2023-06-14 DIAGNOSIS — R1030 Lower abdominal pain, unspecified: Secondary | ICD-10-CM | POA: Diagnosis not present

## 2023-06-14 DIAGNOSIS — Z9181 History of falling: Secondary | ICD-10-CM | POA: Diagnosis not present

## 2023-06-14 DIAGNOSIS — R29898 Other symptoms and signs involving the musculoskeletal system: Secondary | ICD-10-CM | POA: Diagnosis not present

## 2023-06-14 DIAGNOSIS — M25562 Pain in left knee: Secondary | ICD-10-CM | POA: Diagnosis not present

## 2023-06-14 DIAGNOSIS — M2569 Stiffness of other specified joint, not elsewhere classified: Secondary | ICD-10-CM | POA: Diagnosis not present

## 2023-06-14 DIAGNOSIS — M545 Low back pain, unspecified: Secondary | ICD-10-CM | POA: Diagnosis not present

## 2023-06-14 DIAGNOSIS — Z7409 Other reduced mobility: Secondary | ICD-10-CM | POA: Diagnosis not present

## 2023-06-16 DIAGNOSIS — M545 Low back pain, unspecified: Secondary | ICD-10-CM | POA: Diagnosis not present

## 2023-06-16 DIAGNOSIS — R1030 Lower abdominal pain, unspecified: Secondary | ICD-10-CM | POA: Diagnosis not present

## 2023-06-16 DIAGNOSIS — R262 Difficulty in walking, not elsewhere classified: Secondary | ICD-10-CM | POA: Diagnosis not present

## 2023-06-16 DIAGNOSIS — R29898 Other symptoms and signs involving the musculoskeletal system: Secondary | ICD-10-CM | POA: Diagnosis not present

## 2023-06-16 DIAGNOSIS — Z7409 Other reduced mobility: Secondary | ICD-10-CM | POA: Diagnosis not present

## 2023-06-16 DIAGNOSIS — M25562 Pain in left knee: Secondary | ICD-10-CM | POA: Diagnosis not present

## 2023-06-16 DIAGNOSIS — Z96652 Presence of left artificial knee joint: Secondary | ICD-10-CM | POA: Diagnosis not present

## 2023-06-16 DIAGNOSIS — M25552 Pain in left hip: Secondary | ICD-10-CM | POA: Diagnosis not present

## 2023-06-16 DIAGNOSIS — Z9181 History of falling: Secondary | ICD-10-CM | POA: Diagnosis not present

## 2023-06-16 DIAGNOSIS — R2689 Other abnormalities of gait and mobility: Secondary | ICD-10-CM | POA: Diagnosis not present

## 2023-06-16 DIAGNOSIS — M2569 Stiffness of other specified joint, not elsewhere classified: Secondary | ICD-10-CM | POA: Diagnosis not present

## 2023-06-16 DIAGNOSIS — M6281 Muscle weakness (generalized): Secondary | ICD-10-CM | POA: Diagnosis not present

## 2023-06-16 DIAGNOSIS — M79605 Pain in left leg: Secondary | ICD-10-CM | POA: Diagnosis not present

## 2023-06-21 DIAGNOSIS — R1030 Lower abdominal pain, unspecified: Secondary | ICD-10-CM | POA: Diagnosis not present

## 2023-06-21 DIAGNOSIS — Z7409 Other reduced mobility: Secondary | ICD-10-CM | POA: Diagnosis not present

## 2023-06-21 DIAGNOSIS — R29898 Other symptoms and signs involving the musculoskeletal system: Secondary | ICD-10-CM | POA: Diagnosis not present

## 2023-06-21 DIAGNOSIS — M25562 Pain in left knee: Secondary | ICD-10-CM | POA: Diagnosis not present

## 2023-06-21 DIAGNOSIS — Z9181 History of falling: Secondary | ICD-10-CM | POA: Diagnosis not present

## 2023-06-21 DIAGNOSIS — M2569 Stiffness of other specified joint, not elsewhere classified: Secondary | ICD-10-CM | POA: Diagnosis not present

## 2023-06-21 DIAGNOSIS — M79605 Pain in left leg: Secondary | ICD-10-CM | POA: Diagnosis not present

## 2023-06-21 DIAGNOSIS — M25552 Pain in left hip: Secondary | ICD-10-CM | POA: Diagnosis not present

## 2023-06-21 DIAGNOSIS — M6281 Muscle weakness (generalized): Secondary | ICD-10-CM | POA: Diagnosis not present

## 2023-06-21 DIAGNOSIS — Z96652 Presence of left artificial knee joint: Secondary | ICD-10-CM | POA: Diagnosis not present

## 2023-06-21 DIAGNOSIS — R2689 Other abnormalities of gait and mobility: Secondary | ICD-10-CM | POA: Diagnosis not present

## 2023-06-21 DIAGNOSIS — R262 Difficulty in walking, not elsewhere classified: Secondary | ICD-10-CM | POA: Diagnosis not present

## 2023-06-21 DIAGNOSIS — M545 Low back pain, unspecified: Secondary | ICD-10-CM | POA: Diagnosis not present

## 2023-06-26 DIAGNOSIS — I10 Essential (primary) hypertension: Secondary | ICD-10-CM | POA: Diagnosis not present

## 2023-06-26 DIAGNOSIS — E1143 Type 2 diabetes mellitus with diabetic autonomic (poly)neuropathy: Secondary | ICD-10-CM | POA: Diagnosis not present

## 2023-06-26 DIAGNOSIS — Z0000 Encounter for general adult medical examination without abnormal findings: Secondary | ICD-10-CM | POA: Diagnosis not present

## 2023-06-26 DIAGNOSIS — N23 Unspecified renal colic: Secondary | ICD-10-CM | POA: Diagnosis not present

## 2023-06-26 DIAGNOSIS — J4530 Mild persistent asthma, uncomplicated: Secondary | ICD-10-CM | POA: Diagnosis not present

## 2023-06-26 DIAGNOSIS — M179 Osteoarthritis of knee, unspecified: Secondary | ICD-10-CM | POA: Diagnosis not present

## 2023-06-26 DIAGNOSIS — E782 Mixed hyperlipidemia: Secondary | ICD-10-CM | POA: Diagnosis not present

## 2023-06-26 DIAGNOSIS — J302 Other seasonal allergic rhinitis: Secondary | ICD-10-CM | POA: Diagnosis not present

## 2023-06-27 DIAGNOSIS — M2569 Stiffness of other specified joint, not elsewhere classified: Secondary | ICD-10-CM | POA: Diagnosis not present

## 2023-06-27 DIAGNOSIS — Z9181 History of falling: Secondary | ICD-10-CM | POA: Diagnosis not present

## 2023-06-27 DIAGNOSIS — Z7409 Other reduced mobility: Secondary | ICD-10-CM | POA: Diagnosis not present

## 2023-06-27 DIAGNOSIS — M79605 Pain in left leg: Secondary | ICD-10-CM | POA: Diagnosis not present

## 2023-06-27 DIAGNOSIS — M6281 Muscle weakness (generalized): Secondary | ICD-10-CM | POA: Diagnosis not present

## 2023-06-27 DIAGNOSIS — R29898 Other symptoms and signs involving the musculoskeletal system: Secondary | ICD-10-CM | POA: Diagnosis not present

## 2023-06-27 DIAGNOSIS — R262 Difficulty in walking, not elsewhere classified: Secondary | ICD-10-CM | POA: Diagnosis not present

## 2023-06-27 DIAGNOSIS — M25552 Pain in left hip: Secondary | ICD-10-CM | POA: Diagnosis not present

## 2023-06-27 DIAGNOSIS — M25562 Pain in left knee: Secondary | ICD-10-CM | POA: Diagnosis not present

## 2023-06-27 DIAGNOSIS — R2689 Other abnormalities of gait and mobility: Secondary | ICD-10-CM | POA: Diagnosis not present

## 2023-06-27 DIAGNOSIS — R1030 Lower abdominal pain, unspecified: Secondary | ICD-10-CM | POA: Diagnosis not present

## 2023-06-27 DIAGNOSIS — Z96652 Presence of left artificial knee joint: Secondary | ICD-10-CM | POA: Diagnosis not present

## 2023-06-27 DIAGNOSIS — M545 Low back pain, unspecified: Secondary | ICD-10-CM | POA: Diagnosis not present

## 2023-06-28 DIAGNOSIS — R29898 Other symptoms and signs involving the musculoskeletal system: Secondary | ICD-10-CM | POA: Diagnosis not present

## 2023-06-28 DIAGNOSIS — M6281 Muscle weakness (generalized): Secondary | ICD-10-CM | POA: Diagnosis not present

## 2023-06-28 DIAGNOSIS — M2569 Stiffness of other specified joint, not elsewhere classified: Secondary | ICD-10-CM | POA: Diagnosis not present

## 2023-06-28 DIAGNOSIS — R1030 Lower abdominal pain, unspecified: Secondary | ICD-10-CM | POA: Diagnosis not present

## 2023-06-28 DIAGNOSIS — M25562 Pain in left knee: Secondary | ICD-10-CM | POA: Diagnosis not present

## 2023-06-28 DIAGNOSIS — M79605 Pain in left leg: Secondary | ICD-10-CM | POA: Diagnosis not present

## 2023-06-28 DIAGNOSIS — M25552 Pain in left hip: Secondary | ICD-10-CM | POA: Diagnosis not present

## 2023-06-28 DIAGNOSIS — Z9181 History of falling: Secondary | ICD-10-CM | POA: Diagnosis not present

## 2023-06-28 DIAGNOSIS — Z7409 Other reduced mobility: Secondary | ICD-10-CM | POA: Diagnosis not present

## 2023-06-28 DIAGNOSIS — Z96652 Presence of left artificial knee joint: Secondary | ICD-10-CM | POA: Diagnosis not present

## 2023-06-28 DIAGNOSIS — R262 Difficulty in walking, not elsewhere classified: Secondary | ICD-10-CM | POA: Diagnosis not present

## 2023-06-28 DIAGNOSIS — R2689 Other abnormalities of gait and mobility: Secondary | ICD-10-CM | POA: Diagnosis not present

## 2023-06-28 DIAGNOSIS — M545 Low back pain, unspecified: Secondary | ICD-10-CM | POA: Diagnosis not present

## 2023-06-29 ENCOUNTER — Ambulatory Visit (INDEPENDENT_AMBULATORY_CARE_PROVIDER_SITE_OTHER): Payer: 59 | Admitting: Orthopaedic Surgery

## 2023-06-29 ENCOUNTER — Encounter: Payer: Self-pay | Admitting: Orthopaedic Surgery

## 2023-06-29 VITALS — Ht 63.5 in | Wt 168.0 lb

## 2023-06-29 DIAGNOSIS — Z96652 Presence of left artificial knee joint: Secondary | ICD-10-CM

## 2023-06-29 DIAGNOSIS — M6281 Muscle weakness (generalized): Secondary | ICD-10-CM | POA: Diagnosis not present

## 2023-06-29 DIAGNOSIS — Z96642 Presence of left artificial hip joint: Secondary | ICD-10-CM | POA: Diagnosis not present

## 2023-06-30 NOTE — Progress Notes (Signed)
 Office Visit Note   Patient: Taylor Cohen           Date of Birth: [birth year]           MRN: [id number] Visit Date: 06/29/2023              Requested by: Orpha Yancey LABOR, MD [address] [place],  [place] [address] PCP: Orpha Yancey LABOR, MD   Assessment & Plan: Visit Diagnoses:  1. Weakness of left quadriceps muscle   2. S/P total left hip arthroplasty   3. Status post total left knee replacement     Plan: continue quad strengthening. She needs to get her quad strong to fix her falling problem. Continue therapy  Follow-Up Instructions: No follow-ups on file.   Orders:  No orders of the defined types were placed in this encounter.  No orders of the defined types were placed in this encounter.     Procedures: No procedures performed   Clinical Data: No additional findings.   Subjective: Chief Complaint  Patient presents with   Left Knee - Pain    Fall 06/24/2023   Left Hip - Pain    Fall 06/24/2023    HPI 68 year old female returns today and states she was helping a child walking up steps her leg felt like it was giving way with quad weakness.  She has a cane she has been using and originally had total hip arthroplasty was doing well then had total knee arthroplasty and after total knee when she is doing therapy she states she pulled something anteriorly in her proximal quad with pain and weakness and problems since.  She still doing therapy trying to get better she switched to a different therapy facility.  X-rays hip and knee both look good.  No numbness or tingling in her feet.  No associated back pain.  Review of Systems all systems noncontributory.   Objective: Vital Signs: Ht 5' 3.5 (1.613 m)   Wt 168 lb (76.2 kg)   BMI 29.29 kg/m   Physical Exam Constitutional:      Appearance: She is well-developed.  HENT:     Head: Normocephalic.     Right Ear: External ear normal.     Left Ear: External ear normal. There is no impacted cerumen.  Eyes:      Pupils: Pupils are equal, round, and reactive to light.  Neck:     Thyroid: No thyromegaly.     Trachea: No tracheal deviation.  Cardiovascular:     Rate and Rhythm: Normal rate.  Pulmonary:     Effort: Pulmonary effort is normal.  Abdominal:     Palpations: Abdomen is soft.  Musculoskeletal:     Cervical back: No rigidity.  Skin:    General: Skin is warm and dry.  Neurological:     Mental Status: She is alert and oriented to person, place, and time.  Psychiatric:        Behavior: Behavior normal.     Ortho Exam patient has limitation in external rotation left hip some stiffness and still has some quad weakness with resisted testing.  Quad strength is improved from 2 months ago.  Knee and hip incision are both well-healed.  Specialty Comments:  No specialty comments available.  Imaging: No results found.   PMFS History: Patient Active Problem List   Diagnosis Date Noted   Weakness of left quadriceps muscle 12/15/2022   S/P total knee arthroplasty 07/25/2022   S/P total left hip arthroplasty  09/02/2021   Dysphagia 02/15/2021   Past Medical History:  Diagnosis Date   Arthritis    Asthma    Breast cancer (HCC)    Around 2004-2005. Left; s/p mastectomy and chemo   Depression    Diabetes (HCC)    Type 2   Dyspnea    GERD (gastroesophageal reflux disease)    History of kidney stones    HLD (hyperlipidemia)    HTN (hypertension)    Pneumonia    Hx    Family History  Problem Relation Age of Onset   Colon cancer Neg Hx    Stomach cancer Neg Hx    Esophageal cancer Neg Hx     Past Surgical History:  Procedure Laterality Date   BALLOON DILATION N/A 03/02/2021   Procedure: BALLOON DILATION;  Surgeon: Cindie Carlin POUR, DO;  [facility];  Service: Endoscopy;  Laterality: N/A;   BIOPSY  03/02/2021   Procedure: BIOPSY;  Surgeon: Cindie Carlin POUR, DO;  [facility];  Service: Endoscopy;;   BREAST SURGERY     left mastectomy   COLONOSCOPY      [facility]   ESOPHAGOGASTRODUODENOSCOPY (EGD) WITH PROPOFOL  N/A 03/02/2021   Procedure: ESOPHAGOGASTRODUODENOSCOPY (EGD) WITH PROPOFOL ;  Surgeon: Cindie Carlin POUR, DO;  [facility];  Service: Endoscopy;  Laterality: N/A;  10:00am   MASTECTOMY Left    2004 or 2005   TOTAL HIP ARTHROPLASTY Left 08/18/2021   Procedure: Left TOTAL HIP ARTHROPLASTY ANTERIOR APPROACH;  Surgeon: Barbarann Oneil BROCKS, MD;  [facility];  Service: Orthopedics;  Laterality: Left;   TOTAL KNEE ARTHROPLASTY Left 07/25/2022   Procedure: LEFT TOTAL KNEE ARTHROPLASTY;  Surgeon: Barbarann Oneil BROCKS, MD;  [facility];  Service: Orthopedics;  Laterality: Left;  Needs RNFA   TUBAL LIGATION     Social History   Occupational History   Not on file  Tobacco Use   Smoking status: Never   Smokeless tobacco: Never  Vaping Use   Vaping status: Never Used  Substance and Sexual Activity   Alcohol use: Never   Drug use: Never   Sexual activity: Yes

## 2023-07-06 DIAGNOSIS — M25562 Pain in left knee: Secondary | ICD-10-CM | POA: Diagnosis not present

## 2023-07-06 DIAGNOSIS — Z96652 Presence of left artificial knee joint: Secondary | ICD-10-CM | POA: Diagnosis not present

## 2023-07-06 DIAGNOSIS — R1030 Lower abdominal pain, unspecified: Secondary | ICD-10-CM | POA: Diagnosis not present

## 2023-07-06 DIAGNOSIS — Z9181 History of falling: Secondary | ICD-10-CM | POA: Diagnosis not present

## 2023-07-06 DIAGNOSIS — R29898 Other symptoms and signs involving the musculoskeletal system: Secondary | ICD-10-CM | POA: Diagnosis not present

## 2023-07-06 DIAGNOSIS — M2569 Stiffness of other specified joint, not elsewhere classified: Secondary | ICD-10-CM | POA: Diagnosis not present

## 2023-07-06 DIAGNOSIS — Z7409 Other reduced mobility: Secondary | ICD-10-CM | POA: Diagnosis not present

## 2023-07-06 DIAGNOSIS — R262 Difficulty in walking, not elsewhere classified: Secondary | ICD-10-CM | POA: Diagnosis not present

## 2023-07-06 DIAGNOSIS — M545 Low back pain, unspecified: Secondary | ICD-10-CM | POA: Diagnosis not present

## 2023-07-06 DIAGNOSIS — R2689 Other abnormalities of gait and mobility: Secondary | ICD-10-CM | POA: Diagnosis not present

## 2023-07-06 DIAGNOSIS — M79605 Pain in left leg: Secondary | ICD-10-CM | POA: Diagnosis not present

## 2023-07-06 DIAGNOSIS — M25552 Pain in left hip: Secondary | ICD-10-CM | POA: Diagnosis not present

## 2023-07-06 DIAGNOSIS — M6281 Muscle weakness (generalized): Secondary | ICD-10-CM | POA: Diagnosis not present

## 2023-07-10 DIAGNOSIS — Z7409 Other reduced mobility: Secondary | ICD-10-CM | POA: Diagnosis not present

## 2023-07-10 DIAGNOSIS — R29898 Other symptoms and signs involving the musculoskeletal system: Secondary | ICD-10-CM | POA: Diagnosis not present

## 2023-07-10 DIAGNOSIS — R1030 Lower abdominal pain, unspecified: Secondary | ICD-10-CM | POA: Diagnosis not present

## 2023-07-10 DIAGNOSIS — Z96652 Presence of left artificial knee joint: Secondary | ICD-10-CM | POA: Diagnosis not present

## 2023-07-10 DIAGNOSIS — M79605 Pain in left leg: Secondary | ICD-10-CM | POA: Diagnosis not present

## 2023-07-10 DIAGNOSIS — M25562 Pain in left knee: Secondary | ICD-10-CM | POA: Diagnosis not present

## 2023-07-10 DIAGNOSIS — M6281 Muscle weakness (generalized): Secondary | ICD-10-CM | POA: Diagnosis not present

## 2023-07-10 DIAGNOSIS — R262 Difficulty in walking, not elsewhere classified: Secondary | ICD-10-CM | POA: Diagnosis not present

## 2023-07-10 DIAGNOSIS — M545 Low back pain, unspecified: Secondary | ICD-10-CM | POA: Diagnosis not present

## 2023-07-10 DIAGNOSIS — R2689 Other abnormalities of gait and mobility: Secondary | ICD-10-CM | POA: Diagnosis not present

## 2023-07-10 DIAGNOSIS — M2569 Stiffness of other specified joint, not elsewhere classified: Secondary | ICD-10-CM | POA: Diagnosis not present

## 2023-07-10 DIAGNOSIS — M25552 Pain in left hip: Secondary | ICD-10-CM | POA: Diagnosis not present

## 2023-07-10 DIAGNOSIS — Z9181 History of falling: Secondary | ICD-10-CM | POA: Diagnosis not present

## 2023-07-13 ENCOUNTER — Ambulatory Visit: Payer: 59 | Admitting: Orthopaedic Surgery

## 2023-07-17 DIAGNOSIS — M25552 Pain in left hip: Secondary | ICD-10-CM | POA: Diagnosis not present

## 2023-07-17 DIAGNOSIS — R1030 Lower abdominal pain, unspecified: Secondary | ICD-10-CM | POA: Diagnosis not present

## 2023-07-17 DIAGNOSIS — M6281 Muscle weakness (generalized): Secondary | ICD-10-CM | POA: Diagnosis not present

## 2023-07-17 DIAGNOSIS — R262 Difficulty in walking, not elsewhere classified: Secondary | ICD-10-CM | POA: Diagnosis not present

## 2023-07-17 DIAGNOSIS — Z96652 Presence of left artificial knee joint: Secondary | ICD-10-CM | POA: Diagnosis not present

## 2023-07-17 DIAGNOSIS — M545 Low back pain, unspecified: Secondary | ICD-10-CM | POA: Diagnosis not present

## 2023-07-17 DIAGNOSIS — M25562 Pain in left knee: Secondary | ICD-10-CM | POA: Diagnosis not present

## 2023-07-17 DIAGNOSIS — Z7409 Other reduced mobility: Secondary | ICD-10-CM | POA: Diagnosis not present

## 2023-07-17 DIAGNOSIS — M79605 Pain in left leg: Secondary | ICD-10-CM | POA: Diagnosis not present

## 2023-07-17 DIAGNOSIS — R29898 Other symptoms and signs involving the musculoskeletal system: Secondary | ICD-10-CM | POA: Diagnosis not present

## 2023-07-17 DIAGNOSIS — R2689 Other abnormalities of gait and mobility: Secondary | ICD-10-CM | POA: Diagnosis not present

## 2023-07-17 DIAGNOSIS — Z9181 History of falling: Secondary | ICD-10-CM | POA: Diagnosis not present

## 2023-07-17 DIAGNOSIS — M2569 Stiffness of other specified joint, not elsewhere classified: Secondary | ICD-10-CM | POA: Diagnosis not present

## 2023-07-21 DIAGNOSIS — R1030 Lower abdominal pain, unspecified: Secondary | ICD-10-CM | POA: Diagnosis not present

## 2023-07-21 DIAGNOSIS — M25562 Pain in left knee: Secondary | ICD-10-CM | POA: Diagnosis not present

## 2023-07-21 DIAGNOSIS — M79605 Pain in left leg: Secondary | ICD-10-CM | POA: Diagnosis not present

## 2023-07-21 DIAGNOSIS — M545 Low back pain, unspecified: Secondary | ICD-10-CM | POA: Diagnosis not present

## 2023-07-21 DIAGNOSIS — Z9181 History of falling: Secondary | ICD-10-CM | POA: Diagnosis not present

## 2023-07-21 DIAGNOSIS — R2689 Other abnormalities of gait and mobility: Secondary | ICD-10-CM | POA: Diagnosis not present

## 2023-07-21 DIAGNOSIS — R262 Difficulty in walking, not elsewhere classified: Secondary | ICD-10-CM | POA: Diagnosis not present

## 2023-07-21 DIAGNOSIS — M6281 Muscle weakness (generalized): Secondary | ICD-10-CM | POA: Diagnosis not present

## 2023-07-21 DIAGNOSIS — M2569 Stiffness of other specified joint, not elsewhere classified: Secondary | ICD-10-CM | POA: Diagnosis not present

## 2023-07-21 DIAGNOSIS — M25552 Pain in left hip: Secondary | ICD-10-CM | POA: Diagnosis not present

## 2023-07-21 DIAGNOSIS — Z96652 Presence of left artificial knee joint: Secondary | ICD-10-CM | POA: Diagnosis not present

## 2023-07-21 DIAGNOSIS — Z7409 Other reduced mobility: Secondary | ICD-10-CM | POA: Diagnosis not present

## 2023-07-21 DIAGNOSIS — R29898 Other symptoms and signs involving the musculoskeletal system: Secondary | ICD-10-CM | POA: Diagnosis not present

## 2023-07-25 DIAGNOSIS — R29898 Other symptoms and signs involving the musculoskeletal system: Secondary | ICD-10-CM | POA: Diagnosis not present

## 2023-07-25 DIAGNOSIS — Z96652 Presence of left artificial knee joint: Secondary | ICD-10-CM | POA: Diagnosis not present

## 2023-07-25 DIAGNOSIS — R1030 Lower abdominal pain, unspecified: Secondary | ICD-10-CM | POA: Diagnosis not present

## 2023-07-25 DIAGNOSIS — M25552 Pain in left hip: Secondary | ICD-10-CM | POA: Diagnosis not present

## 2023-07-25 DIAGNOSIS — M6281 Muscle weakness (generalized): Secondary | ICD-10-CM | POA: Diagnosis not present

## 2023-07-25 DIAGNOSIS — Z7409 Other reduced mobility: Secondary | ICD-10-CM | POA: Diagnosis not present

## 2023-07-25 DIAGNOSIS — R2689 Other abnormalities of gait and mobility: Secondary | ICD-10-CM | POA: Diagnosis not present

## 2023-07-25 DIAGNOSIS — Z9181 History of falling: Secondary | ICD-10-CM | POA: Diagnosis not present

## 2023-07-25 DIAGNOSIS — M545 Low back pain, unspecified: Secondary | ICD-10-CM | POA: Diagnosis not present

## 2023-07-25 DIAGNOSIS — M2569 Stiffness of other specified joint, not elsewhere classified: Secondary | ICD-10-CM | POA: Diagnosis not present

## 2023-07-25 DIAGNOSIS — M79605 Pain in left leg: Secondary | ICD-10-CM | POA: Diagnosis not present

## 2023-07-25 DIAGNOSIS — M25562 Pain in left knee: Secondary | ICD-10-CM | POA: Diagnosis not present

## 2023-07-25 DIAGNOSIS — R262 Difficulty in walking, not elsewhere classified: Secondary | ICD-10-CM | POA: Diagnosis not present

## 2023-07-27 ENCOUNTER — Telehealth: Payer: Self-pay | Admitting: *Deleted

## 2023-07-27 NOTE — Telephone Encounter (Signed)
 Ortho bundle 1 year call completed for Left total knee replacement.

## 2023-07-28 DIAGNOSIS — M79605 Pain in left leg: Secondary | ICD-10-CM | POA: Diagnosis not present

## 2023-07-28 DIAGNOSIS — Z9181 History of falling: Secondary | ICD-10-CM | POA: Diagnosis not present

## 2023-07-28 DIAGNOSIS — M25562 Pain in left knee: Secondary | ICD-10-CM | POA: Diagnosis not present

## 2023-07-28 DIAGNOSIS — M25552 Pain in left hip: Secondary | ICD-10-CM | POA: Diagnosis not present

## 2023-07-28 DIAGNOSIS — M545 Low back pain, unspecified: Secondary | ICD-10-CM | POA: Diagnosis not present

## 2023-07-28 DIAGNOSIS — M6281 Muscle weakness (generalized): Secondary | ICD-10-CM | POA: Diagnosis not present

## 2023-07-28 DIAGNOSIS — R2689 Other abnormalities of gait and mobility: Secondary | ICD-10-CM | POA: Diagnosis not present

## 2023-07-28 DIAGNOSIS — Z7409 Other reduced mobility: Secondary | ICD-10-CM | POA: Diagnosis not present

## 2023-07-28 DIAGNOSIS — R262 Difficulty in walking, not elsewhere classified: Secondary | ICD-10-CM | POA: Diagnosis not present

## 2023-07-28 DIAGNOSIS — R29898 Other symptoms and signs involving the musculoskeletal system: Secondary | ICD-10-CM | POA: Diagnosis not present

## 2023-07-28 DIAGNOSIS — M2569 Stiffness of other specified joint, not elsewhere classified: Secondary | ICD-10-CM | POA: Diagnosis not present

## 2023-07-28 DIAGNOSIS — R1030 Lower abdominal pain, unspecified: Secondary | ICD-10-CM | POA: Diagnosis not present

## 2023-07-28 DIAGNOSIS — Z96652 Presence of left artificial knee joint: Secondary | ICD-10-CM | POA: Diagnosis not present

## 2023-08-02 DIAGNOSIS — M25562 Pain in left knee: Secondary | ICD-10-CM | POA: Diagnosis not present

## 2023-08-02 DIAGNOSIS — R29898 Other symptoms and signs involving the musculoskeletal system: Secondary | ICD-10-CM | POA: Diagnosis not present

## 2023-08-02 DIAGNOSIS — R262 Difficulty in walking, not elsewhere classified: Secondary | ICD-10-CM | POA: Diagnosis not present

## 2023-08-02 DIAGNOSIS — M2569 Stiffness of other specified joint, not elsewhere classified: Secondary | ICD-10-CM | POA: Diagnosis not present

## 2023-08-02 DIAGNOSIS — Z96652 Presence of left artificial knee joint: Secondary | ICD-10-CM | POA: Diagnosis not present

## 2023-08-02 DIAGNOSIS — M545 Low back pain, unspecified: Secondary | ICD-10-CM | POA: Diagnosis not present

## 2023-08-02 DIAGNOSIS — Z7409 Other reduced mobility: Secondary | ICD-10-CM | POA: Diagnosis not present

## 2023-08-02 DIAGNOSIS — R1030 Lower abdominal pain, unspecified: Secondary | ICD-10-CM | POA: Diagnosis not present

## 2023-08-02 DIAGNOSIS — M6281 Muscle weakness (generalized): Secondary | ICD-10-CM | POA: Diagnosis not present

## 2023-08-02 DIAGNOSIS — Z9181 History of falling: Secondary | ICD-10-CM | POA: Diagnosis not present

## 2023-08-02 DIAGNOSIS — M79605 Pain in left leg: Secondary | ICD-10-CM | POA: Diagnosis not present

## 2023-08-02 DIAGNOSIS — R2689 Other abnormalities of gait and mobility: Secondary | ICD-10-CM | POA: Diagnosis not present

## 2023-08-02 DIAGNOSIS — M25552 Pain in left hip: Secondary | ICD-10-CM | POA: Diagnosis not present

## 2023-08-04 DIAGNOSIS — M2569 Stiffness of other specified joint, not elsewhere classified: Secondary | ICD-10-CM | POA: Diagnosis not present

## 2023-08-04 DIAGNOSIS — M25562 Pain in left knee: Secondary | ICD-10-CM | POA: Diagnosis not present

## 2023-08-04 DIAGNOSIS — M545 Low back pain, unspecified: Secondary | ICD-10-CM | POA: Diagnosis not present

## 2023-08-04 DIAGNOSIS — R1030 Lower abdominal pain, unspecified: Secondary | ICD-10-CM | POA: Diagnosis not present

## 2023-08-04 DIAGNOSIS — M79605 Pain in left leg: Secondary | ICD-10-CM | POA: Diagnosis not present

## 2023-08-04 DIAGNOSIS — M6281 Muscle weakness (generalized): Secondary | ICD-10-CM | POA: Diagnosis not present

## 2023-08-04 DIAGNOSIS — R2689 Other abnormalities of gait and mobility: Secondary | ICD-10-CM | POA: Diagnosis not present

## 2023-08-04 DIAGNOSIS — Z9181 History of falling: Secondary | ICD-10-CM | POA: Diagnosis not present

## 2023-08-04 DIAGNOSIS — Z7409 Other reduced mobility: Secondary | ICD-10-CM | POA: Diagnosis not present

## 2023-08-04 DIAGNOSIS — R29898 Other symptoms and signs involving the musculoskeletal system: Secondary | ICD-10-CM | POA: Diagnosis not present

## 2023-08-04 DIAGNOSIS — R262 Difficulty in walking, not elsewhere classified: Secondary | ICD-10-CM | POA: Diagnosis not present

## 2023-08-04 DIAGNOSIS — M25552 Pain in left hip: Secondary | ICD-10-CM | POA: Diagnosis not present

## 2023-08-04 DIAGNOSIS — Z96652 Presence of left artificial knee joint: Secondary | ICD-10-CM | POA: Diagnosis not present

## 2023-08-09 DIAGNOSIS — M2569 Stiffness of other specified joint, not elsewhere classified: Secondary | ICD-10-CM | POA: Diagnosis not present

## 2023-08-09 DIAGNOSIS — Z7409 Other reduced mobility: Secondary | ICD-10-CM | POA: Diagnosis not present

## 2023-08-09 DIAGNOSIS — Z96652 Presence of left artificial knee joint: Secondary | ICD-10-CM | POA: Diagnosis not present

## 2023-08-09 DIAGNOSIS — R1030 Lower abdominal pain, unspecified: Secondary | ICD-10-CM | POA: Diagnosis not present

## 2023-08-09 DIAGNOSIS — M25562 Pain in left knee: Secondary | ICD-10-CM | POA: Diagnosis not present

## 2023-08-09 DIAGNOSIS — Z9181 History of falling: Secondary | ICD-10-CM | POA: Diagnosis not present

## 2023-08-09 DIAGNOSIS — R29898 Other symptoms and signs involving the musculoskeletal system: Secondary | ICD-10-CM | POA: Diagnosis not present

## 2023-08-09 DIAGNOSIS — M79605 Pain in left leg: Secondary | ICD-10-CM | POA: Diagnosis not present

## 2023-08-09 DIAGNOSIS — M6281 Muscle weakness (generalized): Secondary | ICD-10-CM | POA: Diagnosis not present

## 2023-08-09 DIAGNOSIS — R262 Difficulty in walking, not elsewhere classified: Secondary | ICD-10-CM | POA: Diagnosis not present

## 2023-08-09 DIAGNOSIS — M25552 Pain in left hip: Secondary | ICD-10-CM | POA: Diagnosis not present

## 2023-08-09 DIAGNOSIS — R2689 Other abnormalities of gait and mobility: Secondary | ICD-10-CM | POA: Diagnosis not present

## 2023-08-09 DIAGNOSIS — M545 Low back pain, unspecified: Secondary | ICD-10-CM | POA: Diagnosis not present

## 2023-08-15 DIAGNOSIS — R262 Difficulty in walking, not elsewhere classified: Secondary | ICD-10-CM | POA: Diagnosis not present

## 2023-08-15 DIAGNOSIS — R29898 Other symptoms and signs involving the musculoskeletal system: Secondary | ICD-10-CM | POA: Diagnosis not present

## 2023-08-15 DIAGNOSIS — Z7409 Other reduced mobility: Secondary | ICD-10-CM | POA: Diagnosis not present

## 2023-08-15 DIAGNOSIS — R1030 Lower abdominal pain, unspecified: Secondary | ICD-10-CM | POA: Diagnosis not present

## 2023-08-15 DIAGNOSIS — M2569 Stiffness of other specified joint, not elsewhere classified: Secondary | ICD-10-CM | POA: Diagnosis not present

## 2023-08-15 DIAGNOSIS — Z96652 Presence of left artificial knee joint: Secondary | ICD-10-CM | POA: Diagnosis not present

## 2023-08-15 DIAGNOSIS — M25552 Pain in left hip: Secondary | ICD-10-CM | POA: Diagnosis not present

## 2023-08-15 DIAGNOSIS — M545 Low back pain, unspecified: Secondary | ICD-10-CM | POA: Diagnosis not present

## 2023-08-15 DIAGNOSIS — R2689 Other abnormalities of gait and mobility: Secondary | ICD-10-CM | POA: Diagnosis not present

## 2023-08-15 DIAGNOSIS — Z9181 History of falling: Secondary | ICD-10-CM | POA: Diagnosis not present

## 2023-08-15 DIAGNOSIS — M79605 Pain in left leg: Secondary | ICD-10-CM | POA: Diagnosis not present

## 2023-08-15 DIAGNOSIS — M6281 Muscle weakness (generalized): Secondary | ICD-10-CM | POA: Diagnosis not present

## 2023-08-15 DIAGNOSIS — M25562 Pain in left knee: Secondary | ICD-10-CM | POA: Diagnosis not present

## 2023-08-16 ENCOUNTER — Ambulatory Visit (INDEPENDENT_AMBULATORY_CARE_PROVIDER_SITE_OTHER): Admitting: Orthopaedic Surgery

## 2023-08-16 ENCOUNTER — Encounter: Payer: Self-pay | Admitting: Orthopaedic Surgery

## 2023-08-16 DIAGNOSIS — Z96652 Presence of left artificial knee joint: Secondary | ICD-10-CM | POA: Diagnosis not present

## 2023-08-16 DIAGNOSIS — Z96642 Presence of left artificial hip joint: Secondary | ICD-10-CM

## 2023-08-16 DIAGNOSIS — M6281 Muscle weakness (generalized): Secondary | ICD-10-CM

## 2023-08-16 NOTE — Progress Notes (Signed)
 Office Visit Note   Patient: Taylor Cohen           Date of Birth: [birth year]           MRN: [id number] Visit Date: 08/16/2023              Requested by: Toma Deiters, MD [address] [place],  [place] [address] PCP: Toma Deiters, MD   Assessment & Plan: Visit Diagnoses:  1. Weakness of left quadriceps muscle   2. Status post total left knee replacement   3. S/P total left hip arthroplasty     Plan: She has another therapy visit scheduled [facility].  Discussed with her she will need to continue strengthening until she gets back to normal strength in her quad which should take care of all her symptoms.  She is happy with results of surgery of her left hip and knee replacement.  Follow-Up Instructions: No follow-ups on file.   Orders:  No orders of the defined types were placed in this encounter.  No orders of the defined types were placed in this encounter.     Procedures: No procedures performed   Clinical Data: No additional findings.   Subjective: Chief Complaint  Patient presents with   Left Knee - Pain, Follow-up    Fall 06/24/2023   Left Hip - Follow-up, Pain    Fall 06/24/2023    HPI 68 year old female post total knee and total hip arthroplasty.  Rehabbing left total knee showed flexor muscle pull over quad at the hip and has had persistent slow gradual progress with left quad strength improvement.  She still in therapy and has another visit scheduled Friday.  She is walking without a cane.  Review of Systems    Objective: Vital Signs: There were no vitals taken for this visit.  Physical Exam Constitutional:      Appearance: She is well-developed.  HENT:     Head: Normocephalic.     Right Ear: External ear normal.     Left Ear: External ear normal. There is no impacted cerumen.  Eyes:     Pupils: Pupils are equal, round, and reactive to light.  Neck:     Thyroid: No thyromegaly.     Trachea: No  tracheal deviation.  Cardiovascular:     Rate and Rhythm: Normal rate.  Pulmonary:     Effort: Pulmonary effort is normal.  Abdominal:     Palpations: Abdomen is soft.  Musculoskeletal:     Cervical back: No rigidity.  Skin:    General: Skin is warm and dry.  Neurological:     Mental Status: She is alert and oriented to person, place, and time.  Psychiatric:        Behavior: Behavior normal.     Ortho Exam she can do single step step up without grabbing the table but still has a hop on the left side but not on the right.  Minimal knee swelling.  Good knee flexion full extension.  Specialty Comments:  No specialty comments available.  Imaging: No results found.   PMFS History: Patient Active Problem List   Diagnosis Date Noted   Weakness of left quadriceps muscle 12/15/2022   S/P total knee arthroplasty 07/25/2022   S/P total left hip arthroplasty 09/02/2021   Dysphagia 02/15/2021   Past Medical History:  Diagnosis Date   Arthritis    Asthma    Breast cancer (HCC)  Around 2004-2005. Left; s/p mastectomy and chemo   Depression    Diabetes (HCC)    Type 2   Dyspnea    GERD (gastroesophageal reflux disease)    History of kidney stones    HLD (hyperlipidemia)    HTN (hypertension)    Pneumonia    Hx    Family History  Problem Relation Age of Onset   Colon cancer Neg Hx    Stomach cancer Neg Hx    Esophageal cancer Neg Hx     Past Surgical History:  Procedure Laterality Date   BALLOON DILATION N/A 03/02/2021   Procedure: BALLOON DILATION;  Surgeon: Lanelle Bal, DO;  [facility];  Service: Endoscopy;  Laterality: N/A;   BIOPSY  03/02/2021   Procedure: BIOPSY;  Surgeon: Lanelle Bal, DO;  [facility];  Service: Endoscopy;;   BREAST SURGERY     left mastectomy   COLONOSCOPY     [facility]   ESOPHAGOGASTRODUODENOSCOPY (EGD) WITH PROPOFOL N/A 03/02/2021   Procedure: ESOPHAGOGASTRODUODENOSCOPY (EGD) WITH PROPOFOL;  Surgeon:  Lanelle Bal, DO;  [facility];  Service: Endoscopy;  Laterality: N/A;  10:00am   MASTECTOMY Left    2004 or 2005   TOTAL HIP ARTHROPLASTY Left 08/18/2021   Procedure: Left TOTAL HIP ARTHROPLASTY ANTERIOR APPROACH;  Surgeon: Eldred Manges, MD;  Location: [facility];  Service: Orthopedics;  Laterality: Left;   TOTAL KNEE ARTHROPLASTY Left 07/25/2022   Procedure: LEFT TOTAL KNEE ARTHROPLASTY;  Surgeon: Eldred Manges, MD;  Location: [facility];  Service: Orthopedics;  Laterality: Left;  Needs RNFA   TUBAL LIGATION     Social History   Occupational History   Not on file  Tobacco Use   Smoking status: Never   Smokeless tobacco: Never  Vaping Use   Vaping status: Never Used  Substance and Sexual Activity   Alcohol use: Never   Drug use: Never   Sexual activity: Yes

## 2023-08-18 DIAGNOSIS — M6281 Muscle weakness (generalized): Secondary | ICD-10-CM | POA: Diagnosis not present

## 2023-08-18 DIAGNOSIS — R1030 Lower abdominal pain, unspecified: Secondary | ICD-10-CM | POA: Diagnosis not present

## 2023-08-18 DIAGNOSIS — M25562 Pain in left knee: Secondary | ICD-10-CM | POA: Diagnosis not present

## 2023-08-18 DIAGNOSIS — M2569 Stiffness of other specified joint, not elsewhere classified: Secondary | ICD-10-CM | POA: Diagnosis not present

## 2023-08-18 DIAGNOSIS — Z96652 Presence of left artificial knee joint: Secondary | ICD-10-CM | POA: Diagnosis not present

## 2023-08-18 DIAGNOSIS — Z9181 History of falling: Secondary | ICD-10-CM | POA: Diagnosis not present

## 2023-08-18 DIAGNOSIS — R262 Difficulty in walking, not elsewhere classified: Secondary | ICD-10-CM | POA: Diagnosis not present

## 2023-08-18 DIAGNOSIS — M25552 Pain in left hip: Secondary | ICD-10-CM | POA: Diagnosis not present

## 2023-08-18 DIAGNOSIS — M545 Low back pain, unspecified: Secondary | ICD-10-CM | POA: Diagnosis not present

## 2023-08-18 DIAGNOSIS — R29898 Other symptoms and signs involving the musculoskeletal system: Secondary | ICD-10-CM | POA: Diagnosis not present

## 2023-08-18 DIAGNOSIS — Z7409 Other reduced mobility: Secondary | ICD-10-CM | POA: Diagnosis not present

## 2023-08-18 DIAGNOSIS — R2689 Other abnormalities of gait and mobility: Secondary | ICD-10-CM | POA: Diagnosis not present

## 2023-08-18 DIAGNOSIS — M79605 Pain in left leg: Secondary | ICD-10-CM | POA: Diagnosis not present

## 2023-08-22 DIAGNOSIS — Z96652 Presence of left artificial knee joint: Secondary | ICD-10-CM | POA: Diagnosis not present

## 2023-08-22 DIAGNOSIS — Z7409 Other reduced mobility: Secondary | ICD-10-CM | POA: Diagnosis not present

## 2023-08-22 DIAGNOSIS — M25562 Pain in left knee: Secondary | ICD-10-CM | POA: Diagnosis not present

## 2023-08-22 DIAGNOSIS — R29898 Other symptoms and signs involving the musculoskeletal system: Secondary | ICD-10-CM | POA: Diagnosis not present

## 2023-08-22 DIAGNOSIS — M2569 Stiffness of other specified joint, not elsewhere classified: Secondary | ICD-10-CM | POA: Diagnosis not present

## 2023-08-22 DIAGNOSIS — M6281 Muscle weakness (generalized): Secondary | ICD-10-CM | POA: Diagnosis not present

## 2023-08-22 DIAGNOSIS — M545 Low back pain, unspecified: Secondary | ICD-10-CM | POA: Diagnosis not present

## 2023-08-22 DIAGNOSIS — R1030 Lower abdominal pain, unspecified: Secondary | ICD-10-CM | POA: Diagnosis not present

## 2023-08-22 DIAGNOSIS — M79605 Pain in left leg: Secondary | ICD-10-CM | POA: Diagnosis not present

## 2023-08-22 DIAGNOSIS — R262 Difficulty in walking, not elsewhere classified: Secondary | ICD-10-CM | POA: Diagnosis not present

## 2023-08-22 DIAGNOSIS — M25552 Pain in left hip: Secondary | ICD-10-CM | POA: Diagnosis not present

## 2023-08-22 DIAGNOSIS — Z9181 History of falling: Secondary | ICD-10-CM | POA: Diagnosis not present

## 2023-08-22 DIAGNOSIS — R2689 Other abnormalities of gait and mobility: Secondary | ICD-10-CM | POA: Diagnosis not present

## 2023-08-29 DIAGNOSIS — Z96652 Presence of left artificial knee joint: Secondary | ICD-10-CM | POA: Diagnosis not present

## 2023-08-29 DIAGNOSIS — R1030 Lower abdominal pain, unspecified: Secondary | ICD-10-CM | POA: Diagnosis not present

## 2023-08-29 DIAGNOSIS — M545 Low back pain, unspecified: Secondary | ICD-10-CM | POA: Diagnosis not present

## 2023-08-29 DIAGNOSIS — M25562 Pain in left knee: Secondary | ICD-10-CM | POA: Diagnosis not present

## 2023-08-29 DIAGNOSIS — M6281 Muscle weakness (generalized): Secondary | ICD-10-CM | POA: Diagnosis not present

## 2023-08-29 DIAGNOSIS — M2569 Stiffness of other specified joint, not elsewhere classified: Secondary | ICD-10-CM | POA: Diagnosis not present

## 2023-08-29 DIAGNOSIS — R2689 Other abnormalities of gait and mobility: Secondary | ICD-10-CM | POA: Diagnosis not present

## 2023-08-29 DIAGNOSIS — R262 Difficulty in walking, not elsewhere classified: Secondary | ICD-10-CM | POA: Diagnosis not present

## 2023-08-29 DIAGNOSIS — M79605 Pain in left leg: Secondary | ICD-10-CM | POA: Diagnosis not present

## 2023-08-29 DIAGNOSIS — Z7409 Other reduced mobility: Secondary | ICD-10-CM | POA: Diagnosis not present

## 2023-08-29 DIAGNOSIS — R29898 Other symptoms and signs involving the musculoskeletal system: Secondary | ICD-10-CM | POA: Diagnosis not present

## 2023-08-29 DIAGNOSIS — M25552 Pain in left hip: Secondary | ICD-10-CM | POA: Diagnosis not present

## 2023-08-29 DIAGNOSIS — Z9181 History of falling: Secondary | ICD-10-CM | POA: Diagnosis not present

## 2023-09-25 DIAGNOSIS — J4530 Mild persistent asthma, uncomplicated: Secondary | ICD-10-CM | POA: Diagnosis not present

## 2023-09-25 DIAGNOSIS — I10 Essential (primary) hypertension: Secondary | ICD-10-CM | POA: Diagnosis not present

## 2023-09-25 DIAGNOSIS — E782 Mixed hyperlipidemia: Secondary | ICD-10-CM | POA: Diagnosis not present

## 2023-09-25 DIAGNOSIS — Z0000 Encounter for general adult medical examination without abnormal findings: Secondary | ICD-10-CM | POA: Diagnosis not present

## 2023-09-25 DIAGNOSIS — E1143 Type 2 diabetes mellitus with diabetic autonomic (poly)neuropathy: Secondary | ICD-10-CM | POA: Diagnosis not present

## 2023-09-25 DIAGNOSIS — J302 Other seasonal allergic rhinitis: Secondary | ICD-10-CM | POA: Diagnosis not present

## 2023-09-25 DIAGNOSIS — M179 Osteoarthritis of knee, unspecified: Secondary | ICD-10-CM | POA: Diagnosis not present

## 2023-10-06 DIAGNOSIS — Z1231 Encounter for screening mammogram for malignant neoplasm of breast: Secondary | ICD-10-CM | POA: Diagnosis not present

## 2023-10-06 DIAGNOSIS — Z853 Personal history of malignant neoplasm of breast: Secondary | ICD-10-CM | POA: Diagnosis not present

## 2023-12-05 DIAGNOSIS — E1143 Type 2 diabetes mellitus with diabetic autonomic (poly)neuropathy: Secondary | ICD-10-CM | POA: Diagnosis not present

## 2023-12-05 DIAGNOSIS — E782 Mixed hyperlipidemia: Secondary | ICD-10-CM | POA: Diagnosis not present

## 2023-12-05 DIAGNOSIS — J302 Other seasonal allergic rhinitis: Secondary | ICD-10-CM | POA: Diagnosis not present

## 2023-12-05 DIAGNOSIS — J4530 Mild persistent asthma, uncomplicated: Secondary | ICD-10-CM | POA: Diagnosis not present

## 2023-12-05 DIAGNOSIS — I10 Essential (primary) hypertension: Secondary | ICD-10-CM | POA: Diagnosis not present

## 2024-01-26 ENCOUNTER — Encounter: Payer: Self-pay | Admitting: Orthopaedic Surgery

## 2024-01-26 ENCOUNTER — Ambulatory Visit: Admitting: Orthopaedic Surgery

## 2024-01-26 ENCOUNTER — Other Ambulatory Visit (INDEPENDENT_AMBULATORY_CARE_PROVIDER_SITE_OTHER)

## 2024-01-26 DIAGNOSIS — G8929 Other chronic pain: Secondary | ICD-10-CM | POA: Diagnosis not present

## 2024-01-26 DIAGNOSIS — M25562 Pain in left knee: Secondary | ICD-10-CM

## 2024-01-26 NOTE — Progress Notes (Signed)
 Office Visit Note   Patient: Taylor Cohen           Date of Birth: 1955-09-05           MRN: [id number] Visit Date: 01/26/2024              Requested by: Orpha Yancey LABOR, MD [address] [place],  [place] [address] PCP: Orpha Yancey LABOR, MD   Assessment & Plan: Visit Diagnoses:  1. Chronic pain of left knee     Plan: History of Present Illness Taylor Cohen is a 68 year old female who presents with persistent left knee pain following knee replacement surgery.  She experiences persistent left knee pain for three months post-knee replacement surgery, located across the anterior knee and constant, not activity-dependent. Despite physical therapy, she has difficulty with stairs, ascending and descending one step at a time. She avoids anti-inflammatory medications due to diabetes, using Tylenol  for pain. Her knee occasionally pops, with certain movements exacerbating the pain. She engages in quadriceps strengthening and cycling exercises to maintain knee movement.  Physical Exam MUSCULOSKELETAL: Left knee scar nicely healed. No swelling in left knee. Left knee flexion contracture 5-8 degrees. Left knee flexion limited to 92 degrees. Left knee collateral ligaments stable.  Results RADIOLOGY Knee X-Rohl: Implant intact, no complications with components, no abnormalities detected.  Assessment and Plan Left knee pain and stiffness following total knee arthroplasty with mild flexion contracture and arthrofibrosis Chronic pain and stiffness post-arthroplasty with mild flexion contracture and arthrofibrosis. X-rays show no implant issues. Quadriceps weakness noted. Discussed potential lumbar disc pathology exacerbation. Surgical options include revision knee replacement with risks and benefits explained. - Continue quadriceps strengthening exercises. - Consider revision knee replacement if symptoms significantly impact quality of life. - Manage pain with Tylenol  for now.  Total  face to face encounter time was greater than 25 minutes and over half of this time was spent in counseling and/or coordination of care.  Follow-Up Instructions: No follow-ups on file.   Orders:  Orders Placed This Encounter  Procedures   XR KNEE 3 VIEW LEFT   No orders of the defined types were placed in this encounter.     Procedures: No procedures performed   Clinical Data: No additional findings.   Subjective: Chief Complaint  Patient presents with   Left Knee - Pain    HPI  Review of Systems  Constitutional: Negative.   HENT: Negative.    Eyes: Negative.   Respiratory: Negative.    Cardiovascular: Negative.   Endocrine: Negative.   Musculoskeletal: Negative.   Neurological: Negative.   Hematological: Negative.   Psychiatric/Behavioral: Negative.    All other systems reviewed and are negative.    Objective: Vital Signs: There were no vitals taken for this visit.  Physical Exam Vitals and nursing note reviewed.  Constitutional:      Appearance: She is well-developed.  HENT:     Head: Atraumatic.     Nose: Nose normal.  Eyes:     Extraocular Movements: Extraocular movements intact.  Cardiovascular:     Pulses: Normal pulses.  Pulmonary:     Effort: Pulmonary effort is normal.  Abdominal:     Palpations: Abdomen is soft.  Musculoskeletal:     Cervical back: Neck supple.  Skin:    General: Skin is warm.     Capillary Refill: Capillary refill takes less than 2 seconds.  Neurological:     Mental Status: She is alert. Mental status is  at baseline.  Psychiatric:        Behavior: Behavior normal.        Thought Content: Thought content normal.        Judgment: Judgment normal.     Ortho Exam  Specialty Comments:  No specialty comments available.  Imaging: XR KNEE 3 VIEW LEFT Result Date: 01/26/2024 Stable total knee replacement without complication    PMFS History: Patient Active Problem List   Diagnosis Date Noted   Weakness of left  quadriceps muscle 12/15/2022   S/P total knee arthroplasty 07/25/2022   S/P total left hip arthroplasty 09/02/2021   Dysphagia 02/15/2021   Past Medical History:  Diagnosis Date   Arthritis    Asthma    Breast cancer (HCC)    Around 2004-2005. Left; s/p mastectomy and chemo   Depression    Diabetes (HCC)    Type 2   Dyspnea    GERD (gastroesophageal reflux disease)    History of kidney stones    HLD (hyperlipidemia)    HTN (hypertension)    Pneumonia    Hx    Family History  Problem Relation Age of Onset   Colon cancer Neg Hx    Stomach cancer Neg Hx    Esophageal cancer Neg Hx     Past Surgical History:  Procedure Laterality Date   BALLOON DILATION N/A 03/02/2021   Procedure: BALLOON DILATION;  Surgeon: Cindie Carlin POUR, DO;  [facility];  Service: Endoscopy;  Laterality: N/A;   BIOPSY  03/02/2021   Procedure: BIOPSY;  Surgeon: Cindie Carlin POUR, DO;  [facility];  Service: Endoscopy;;   BREAST SURGERY     left mastectomy   COLONOSCOPY     [facility]   ESOPHAGOGASTRODUODENOSCOPY (EGD) WITH PROPOFOL  N/A 03/02/2021   Procedure: ESOPHAGOGASTRODUODENOSCOPY (EGD) WITH PROPOFOL ;  Surgeon: Cindie Carlin POUR, DO;  [facility];  Service: Endoscopy;  Laterality: N/A;  10:00am   MASTECTOMY Left    2004 or 2005   TOTAL HIP ARTHROPLASTY Left 08/18/2021   Procedure: Left TOTAL HIP ARTHROPLASTY ANTERIOR APPROACH;  Surgeon: Barbarann Oneil BROCKS, MD;  Location: [facility];  Service: Orthopedics;  Laterality: Left;   TOTAL KNEE ARTHROPLASTY Left 07/25/2022   Procedure: LEFT TOTAL KNEE ARTHROPLASTY;  Surgeon: Barbarann Oneil BROCKS, MD;  Location: [facility];  Service: Orthopedics;  Laterality: Left;  Needs RNFA   TUBAL LIGATION     Social History   Occupational History   Not on file  Tobacco Use   Smoking status: Never   Smokeless tobacco: Never  Vaping Use   Vaping status: Never Used  Substance and Sexual Activity   Alcohol use: Never   Drug use: Never   Sexual  activity: Yes

## 2024-02-28 NOTE — Progress Notes (Signed)
 Taylor Cohen                                          MRN: [id number]   02/28/2024   The [facility] Quality Team Specialist reviewed this patient medical record for the purposes of chart review for care gap closure. The following were reviewed: chart review for care gap closure-kidney health evaluation for diabetes:eGFR  and uACR.    [facility] Quality Team

## 2024-03-05 DIAGNOSIS — I10 Essential (primary) hypertension: Secondary | ICD-10-CM | POA: Diagnosis not present

## 2024-03-05 DIAGNOSIS — E1143 Type 2 diabetes mellitus with diabetic autonomic (poly)neuropathy: Secondary | ICD-10-CM | POA: Diagnosis not present

## 2024-03-12 DIAGNOSIS — I10 Essential (primary) hypertension: Secondary | ICD-10-CM | POA: Diagnosis not present

## 2024-03-12 DIAGNOSIS — S280XXA Crushed chest, initial encounter: Secondary | ICD-10-CM | POA: Diagnosis not present

## 2024-03-12 DIAGNOSIS — R059 Cough, unspecified: Secondary | ICD-10-CM | POA: Diagnosis not present

## 2024-03-12 DIAGNOSIS — E1343 Other specified diabetes mellitus with diabetic autonomic (poly)neuropathy: Secondary | ICD-10-CM | POA: Diagnosis not present

## 2024-03-12 DIAGNOSIS — J4530 Mild persistent asthma, uncomplicated: Secondary | ICD-10-CM | POA: Diagnosis not present

## 2024-03-12 DIAGNOSIS — R918 Other nonspecific abnormal finding of lung field: Secondary | ICD-10-CM | POA: Diagnosis not present

## 2024-03-12 DIAGNOSIS — E782 Mixed hyperlipidemia: Secondary | ICD-10-CM | POA: Diagnosis not present

## 2024-03-12 DIAGNOSIS — E1143 Type 2 diabetes mellitus with diabetic autonomic (poly)neuropathy: Secondary | ICD-10-CM | POA: Diagnosis not present

## 2024-03-12 DIAGNOSIS — J302 Other seasonal allergic rhinitis: Secondary | ICD-10-CM | POA: Diagnosis not present
